# Patient Record
Sex: Female | Born: 1965 | Race: White | Hispanic: No | Marital: Single | Smoking: Former smoker
Health system: Southern US, Community
[De-identification: ages and names within clinical notes are randomized; demographics above are authoritative.]

## PROBLEM LIST (undated history)

## (undated) DIAGNOSIS — F32A Depression, unspecified: Secondary | ICD-10-CM

## (undated) DIAGNOSIS — F419 Anxiety disorder, unspecified: Secondary | ICD-10-CM

## (undated) DIAGNOSIS — E7800 Pure hypercholesterolemia, unspecified: Secondary | ICD-10-CM

## (undated) DIAGNOSIS — F329 Major depressive disorder, single episode, unspecified: Secondary | ICD-10-CM

## (undated) DIAGNOSIS — L409 Psoriasis, unspecified: Secondary | ICD-10-CM

## (undated) HISTORY — PX: ABDOMINAL HYSTERECTOMY: SHX81

## (undated) HISTORY — PX: FOOT SURGERY: SHX648

---

## 2013-07-23 ENCOUNTER — Encounter (HOSPITAL_COMMUNITY): Payer: Self-pay | Admitting: *Deleted

## 2013-07-23 ENCOUNTER — Ambulatory Visit (HOSPITAL_COMMUNITY)
Admission: RE | Admit: 2013-07-23 | Discharge: 2013-07-23 | Disposition: A | Discharge status: Home / Self Care | Payer: Self-pay | Attending: Psychiatry | Admitting: Psychiatry

## 2013-07-23 HISTORY — DX: Major depressive disorder, single episode, unspecified: F32.9

## 2013-07-23 HISTORY — DX: Depression, unspecified: F32.A

## 2013-07-23 HISTORY — DX: Anxiety disorder, unspecified: F41.9
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
## 2015-12-15 DIAGNOSIS — F331 Major depressive disorder, recurrent, moderate: Secondary | ICD-10-CM | POA: Diagnosis not present

## 2016-02-13 ENCOUNTER — Ambulatory Visit
Admission: RE | Admit: 2016-02-13 | Discharge: 2016-02-13 | Disposition: A | Discharge status: Home / Self Care | Payer: BLUE CROSS/BLUE SHIELD | Source: Ambulatory Visit | Attending: Internal Medicine | Admitting: Internal Medicine

## 2016-02-13 ENCOUNTER — Ambulatory Visit (INDEPENDENT_AMBULATORY_CARE_PROVIDER_SITE_OTHER): Payer: BLUE CROSS/BLUE SHIELD | Admitting: Internal Medicine

## 2016-02-13 ENCOUNTER — Encounter: Payer: Self-pay | Admitting: Internal Medicine

## 2016-02-13 VITALS — BP 118/72 | HR 77 | Temp 98.7°F | Ht 68.0 in | Wt 183.0 lb

## 2016-02-13 DIAGNOSIS — R202 Paresthesia of skin: Secondary | ICD-10-CM

## 2016-02-13 DIAGNOSIS — M542 Cervicalgia: Secondary | ICD-10-CM

## 2016-02-13 DIAGNOSIS — F32A Depression, unspecified: Secondary | ICD-10-CM

## 2016-02-13 DIAGNOSIS — F419 Anxiety disorder, unspecified: Secondary | ICD-10-CM

## 2016-02-13 DIAGNOSIS — F329 Major depressive disorder, single episode, unspecified: Secondary | ICD-10-CM

## 2016-02-13 DIAGNOSIS — F418 Other specified anxiety disorders: Secondary | ICD-10-CM | POA: Diagnosis not present

## 2016-02-13 LAB — CBC
HEMATOCRIT: 41.6 % (ref 35.0–45.0)
Hemoglobin: 14.1 g/dL (ref 11.7–15.5)
MCH: 31.3 pg (ref 27.0–33.0)
MCHC: 33.9 g/dL (ref 32.0–36.0)
MCV: 92.2 fL (ref 80.0–100.0)
MPV: 11.5 fL (ref 7.5–12.5)
PLATELETS: 255 10*3/uL (ref 140–400)
RBC: 4.51 MIL/uL (ref 3.80–5.10)
RDW: 13.2 % (ref 11.0–15.0)
WBC: 7.9 10*3/uL (ref 3.8–10.8)

## 2016-02-13 LAB — FOLATE: FOLATE: 8.9 ng/mL (ref >5.4)

## 2016-02-13 LAB — HEMOGLOBIN A1C
HEMOGLOBIN A1C: 5 % (ref <5.7)
Mean Plasma Glucose: 97 mg/dL

## 2016-02-13 LAB — VITAMIN B12: VITAMIN B 12: 301 pg/mL (ref 200–1100)

## 2016-02-13 NOTE — Progress Notes (Signed)
HPI  Pt presents to the clinic today to establish care and for management of the conditions listed below. She has not had a PCP in many years.  Anxiety and Depression: She takes Effexor daily and reports this does help. She follows with [facility].  She also reports numbness and tingling in her hands and feet. She first noticed this 3 months ago. It is intermittent. She does reports associated neck pain which started 3 months ago but denies low back pain. She does notice it in the middle of the night. She denies swelling in her hands or feet. She has not taken anything OTC for this.   Flu: never Tetanus: unsure Pap Smear: 2006 Mammogram: never Vision Screening: annually Dentist: as needed  Past Medical History:  Diagnosis Date  . Anxiety   . Depression     Current Outpatient Prescriptions  Medication Sig Dispense Refill  . venlafaxine (EFFEXOR) 75 MG tablet Take 75 mg by mouth every morning.     No current facility-administered medications for this visit.     No Known Allergies  Family History  Problem Relation Age of Onset  . Heart disease Maternal Grandmother   . Heart disease Maternal Grandfather   . Alzheimer's disease Paternal Grandmother     Social History   Social History  . Marital status: Single    Spouse name: N/A  . Number of children: N/A  . Years of education: N/A   Occupational History  . Not on file.   Social History Main Topics  . Smoking status: Former Games developermoker  . Smokeless tobacco: Never Used     Comment: recently quit 12/2015  . Alcohol use No  . Drug use: No  . Sexual activity: No   Other Topics Concern  . Not on file   Social History Narrative  . No narrative on file    ROS:  Constitutional: Denies fever, malaise, fatigue, headache or abrupt weight changes.  Respiratory: Denies difficulty breathing, shortness of breath, cough or sputum production.   Cardiovascular: Denies chest pain, chest tightness, palpitations or swelling in the  hands or feet.  Musculoskeletal: Pt reports neck pain. Denies decrease in range of motion, difficulty with gait, muscle pain or joint swelling.  Skin: Denies redness, rashes, lesions or ulcercations.  Neurological: Pt reports numbness and tingling in all extremities. Denies dizziness, difficulty with memory, difficulty with speech or problems with balance and coordination.  Psych: Pt reports history of anxiety and depression. Denies SI/HI.  No other specific complaints in a complete review of systems (except as listed in HPI above).  PE:  BP 118/72   Pulse 77   Temp 98.7 F (37.1 C) (Oral)   Ht 5\' 8"  (1.727 m)   Wt 183 lb (83 kg)   SpO2 97%   BMI 27.83 kg/m  Wt Readings from Last 3 Encounters:  02/13/16 183 lb (83 kg)    General: Appears her stated age, well developed, well nourished in NAD. Skin: Dry and intact. HEENT: PERRLA. Cardiovascular: Normal rate and rhythm. S1,S2 noted.  No murmur, rubs or gallops noted.  Pulmonary/Chest: Normal effort and positive vesicular breath sounds. No respiratory distress. No wheezes, rales or ronchi noted.  Musculoskeletal: Normal extension and rotation to the left of the cervical spine. Decreased flexion and extension to the right. No bony tenderness noted over the cervical or lumbar spine. Strength 5/5 BUE/BLE. Neurological: Alert and oriented. Sensation intact to upper and lower extremities. Negative Phalen's. Negative Tinel's. Psychiatric: She is mildly anxious appearing  today.    Assessment and Plan:  Paresthesia of bilateral upper and lower extremities:  Will check CBC, CMET, A1C, B12, Vit D and Folate Will follow up after results are back  Neck pain, greater than 3 month duration:  Xray cervical spine today  Make an appt for your annual exam Nicki Reaper, NP

## 2016-02-14 LAB — COMPREHENSIVE METABOLIC PANEL
ALT: 28 U/L (ref 6–29)
AST: 19 U/L (ref 10–35)
Albumin: 4.3 g/dL (ref 3.6–5.1)
Alkaline Phosphatase: 68 U/L (ref 33–115)
BUN: 12 mg/dL (ref 7–25)
CALCIUM: 9.6 mg/dL (ref 8.6–10.2)
CHLORIDE: 104 mmol/L (ref 98–110)
CO2: 25 mmol/L (ref 20–31)
Creat: 0.68 mg/dL (ref 0.50–1.10)
GLUCOSE: 67 mg/dL (ref 65–99)
POTASSIUM: 4.1 mmol/L (ref 3.5–5.3)
Sodium: 140 mmol/L (ref 135–146)
Total Bilirubin: 0.2 mg/dL (ref 0.2–1.2)
Total Protein: 6.8 g/dL (ref 6.1–8.1)

## 2016-02-14 LAB — VITAMIN D 25 HYDROXY (VIT D DEFICIENCY, FRACTURES): Vit D, 25-Hydroxy: 20 ng/mL — ABNORMAL LOW (ref 30–100)

## 2016-02-16 DIAGNOSIS — F329 Major depressive disorder, single episode, unspecified: Secondary | ICD-10-CM | POA: Insufficient documentation

## 2016-02-16 DIAGNOSIS — F32A Depression, unspecified: Secondary | ICD-10-CM | POA: Insufficient documentation

## 2016-02-16 DIAGNOSIS — F419 Anxiety disorder, unspecified: Secondary | ICD-10-CM

## 2016-02-16 NOTE — Assessment & Plan Note (Signed)
Controlled on Effexor She will [facility] to [facility]

## 2016-02-16 NOTE — Patient Instructions (Signed)
Paresthesia Paresthesia is an abnormal burning or prickling sensation. This sensation is generally felt in the hands, arms, legs, or feet. However, it may occur in any part of the body. Usually, it is not painful. The feeling may be described as:  Tingling or numbness.  Pins and needles.  Skin crawling.  Buzzing.  Limbs falling asleep.  Itching. Most people experience temporary (transient) paresthesia at some time in their lives. Paresthesia may occur when you breathe too quickly (hyperventilation). It can also occur without any apparent cause. Commonly, paresthesia occurs when pressure is placed on a nerve. The sensation quickly goes away after the pressure is removed. For some people, however, paresthesia is a long-lasting (chronic) condition that is caused by an underlying disorder. If you continue to have paresthesia, you may need further medical evaluation. HOME CARE INSTRUCTIONS Watch your condition for any changes. Taking the following actions may help to lessen any discomfort that you are feeling:  Avoid drinking alcohol.  Try acupuncture or massage to help relieve your symptoms.  Keep all follow-up visits as directed by your health care provider. This is important. SEEK MEDICAL CARE IF:  You continue to have episodes of paresthesia.  Your burning or prickling feeling gets worse when you walk.  You have pain, cramps, or dizziness.  You develop a rash. SEEK IMMEDIATE MEDICAL CARE IF:  You feel weak.  You have trouble walking or moving.  You have problems with speech, understanding, or vision.  You feel confused.  You cannot control your bladder or bowel movements.  You have numbness after an injury.  You faint.   This information is not intended to replace advice given to you by your health care provider. Make sure you discuss any questions you have with your health care provider.   Document Released: 03/26/2002 Document Revised: 08/20/2014 Document Reviewed:  04/01/2014 Elsevier Interactive Patient Education 2016 Elsevier Inc.  

## 2016-02-18 ENCOUNTER — Telehealth: Payer: Self-pay | Admitting: Internal Medicine

## 2016-02-18 ENCOUNTER — Other Ambulatory Visit: Payer: Self-pay | Admitting: Internal Medicine

## 2016-02-18 DIAGNOSIS — R202 Paresthesia of skin: Secondary | ICD-10-CM

## 2016-02-18 MED ORDER — VITAMIN D (ERGOCALCIFEROL) 1.25 MG (50000 UNIT) PO CAPS: 50000.0000 [IU] | ORAL_CAPSULE | ORAL | 0 refills | Status: DC

## 2016-02-18 NOTE — Telephone Encounter (Signed)
Patient returned Gina Montgomery's call.  Patient scheduled lab appointment on 05/18/16.  Patient's pharmacy is [facility]. Patient does want a neurology referral.  Patient can go anytime and said she can go to [place] or [place].

## 2016-02-18 NOTE — Addendum Note (Signed)
Addended by: Roena MaladyEVONTENNO, Pecola Haxton Y on: 02/18/2016 11:30 AM   Modules accepted: Orders

## 2016-03-16 DIAGNOSIS — F331 Major depressive disorder, recurrent, moderate: Secondary | ICD-10-CM | POA: Diagnosis not present

## 2016-03-25 ENCOUNTER — Encounter: Payer: Self-pay | Admitting: Neurology

## 2016-03-25 ENCOUNTER — Ambulatory Visit (INDEPENDENT_AMBULATORY_CARE_PROVIDER_SITE_OTHER): Payer: BLUE CROSS/BLUE SHIELD | Admitting: Neurology

## 2016-03-25 VITALS — BP 100/70 | HR 74 | Ht 68.0 in | Wt 187.8 lb

## 2016-03-25 DIAGNOSIS — R202 Paresthesia of skin: Secondary | ICD-10-CM | POA: Diagnosis not present

## 2016-03-25 DIAGNOSIS — E538 Deficiency of other specified B group vitamins: Secondary | ICD-10-CM | POA: Diagnosis not present

## 2016-03-25 NOTE — Progress Notes (Signed)
[facility]    Provider:  Dr Lucia GaskinsAhern Referring Provider: Lorre MunroeBaity, Regina W, NP Primary Care Physician:  Nicki ReaperBAITY, REGINA, NP  CC: Numbness and tingling in arms and legs  HPI:  Gina Montgomery is a 50 y.o. female here as a referral from Dr. Sampson SiBaity for paresthesias.PMHx depression and anxiety. She has had her hands falling asleep for 6 months. No inciting events. It is all the fingers and shoots up and down the arms. It occurs day and night. Unknown triggers. She has no feeling in the toes since having her feet reconstructed, this is not new. She says she starts feeling the tingling on the top of the head and it travels down her face to the whole body. She has this symptom every day. For example this morning it started in the top of her head and she felt like a quick flush all over her body tingling and right now it is below the neck. Happens every day, usually resolves by 3-4 pm. No weakness. No vision changes, no facial droop, Happens daily. Better if she is focused on something, worse when inactivity. No loss of consciousness. No inciting events, no head trauma, no new medications. No known triggers, always happens in her bed. She wakes up with tingling. No snoring. No significant fatigue during the day. No headaches, no vision changes. No other focal neurologic symptoms or modifiable factors. If she rocks back and forth she can make the symptoms go away. Patient wonders if it is anxiety. No other focal neurologic deficits, or modifiable factors.   Reviewed notes, labs and imaging from outside physicians, which showed:  BUN 12, creatinine 0.68 02/13/2016 B12 301  Review of Systems: Patient complains of symptoms per HPI as well as the following symptoms: Weight gain, fatigue, numbness, weakness, dizziness, depression, anxiety. Pertinent negatives per HPI. All others negative.   Social History   Social History  . Marital status: Single    Spouse name: N/A  . Number of children:  0  . Years of education: 12   Occupational History  . Self- employed     Social History Main Topics  . Smoking status: Former Smoker    Quit date: 01/18/2016  . Smokeless tobacco: Never Used     Comment: recently quit 12/2015  . Alcohol use No  . Drug use: No  . Sexual activity: No   Other Topics Concern  . Not on file   Social History Narrative   Lives  With mother   Caffeine use: 1 cup a day (coffee)   Drinks 1 soda per day    Family History  Problem Relation Age of Onset  . Heart disease Maternal Grandmother   . Heart disease Maternal Grandfather   . Alzheimer's disease Paternal Grandmother   . Cancer Neg Hx   . Diabetes Neg Hx   . Stroke Neg Hx     Past Medical History:  Diagnosis Date  . Anxiety   . Depression     Past Surgical History:  Procedure Laterality Date  . ABDOMINAL HYSTERECTOMY    . FOOT SURGERY Bilateral    75 bolts each foot    Current Outpatient Prescriptions  Medication Sig Dispense Refill  . venlafaxine (EFFEXOR) 75 MG tablet Take 75 mg by mouth every morning.    . Vitamin D, Ergocalciferol, (DRISDOL) 50000 units CAPS capsule Take 1 capsule (50,000 Units total) by mouth every 7 (seven) days. 12 capsule 0   No current facility-administered medications for this visit.  Allergies as of 03/25/2016  . (No Known Allergies)    Vitals: BP 100/70 (BP Location: Right Arm, Patient Position: Sitting, Cuff Size: Normal)   Pulse 74   Ht 5\' 8"  (1.727 m)   Wt 187 lb 12.8 oz (85.2 kg)   BMI 28.55 kg/m  Last Weight:  Wt Readings from Last 1 Encounters:  03/25/16 187 lb 12.8 oz (85.2 kg)   Last Height:   Ht Readings from Last 1 Encounters:  03/25/16 5\' 8"  (1.727 m)    Physical exam: Exam: Gen: NAD, tangential                 CV: RRR, no MRG. No Carotid Bruits. No peripheral edema, warm, nontender Eyes: Conjunctivae clear without exudates or hemorrhage  Neuro: Detailed Neurologic Exam  Speech:    Speech is normal; fluent and  spontaneous with normal comprehension.  Cognition:    The patient is oriented to person, place, and time;     recent and remote memory intact;     language fluent;     normal attention, concentration,     fund of knowledge Cranial Nerves:    The pupils are equal, round, and reactive to light. Attempted fundoscopic exam could not visualize. Visual fields are full to finger confrontation. Extraocular movements are intact. Trigeminal sensation is intact and the muscles of mastication are normal. The face is symmetric. The palate elevates in the midline. Hearing intact. Voice is normal. Shoulder shrug is normal. The tongue has normal motion without fasciculations.   Coordination:    Normal finger to nose and heel to shin.   Gait:  Normal native gait  Motor Observation:    No asymmetry, no atrophy, and no involuntary movements noted. Tone:    Normal muscle tone.    Posture:    Posture is normal. normal erect    Strength:    Strength is V/V in the upper and lower limbs.      Sensation: intact to LT     Reflex Exam:  DTR's:    Deep tendon reflexes in the upper and lower extremities are normal bilaterally.   Toes:    The toes are downgoing bilaterally.   Clonus:    Clonus is absent.      Assessment/Plan:  50 year old with depression and anxiety here for daily episodes of paresthesias starting at the top of her head and traveling throughout her body. She has tingling in the hands and numbness like they are falling asleep. She has chronic numbness in the feet since foot reconstruction.   B12 was 301, low normal, will check MMA  Emg/ncs of bilateral uppers to eval for CTS vs polyneuropathy or radiculopathy EEG to evaluate for sensory seizures/epileptiform activity If above negative consider MRI brain and/or cervical spine Episodes stop if she rocks back and forth, may be anxiety but this is a diagnosis of exclusion  CC: Dr. Daisy FloroBaity  Margit Batte, MD  [facility] [address] [place], [place] [id number]  Phone [phone] Fax [phone]

## 2016-03-25 NOTE — Patient Instructions (Signed)
Remember to drink plenty of fluid, eat healthy meals and do not skip any meals. Try to eat protein with a every meal and eat a healthy snack such as fruit or nuts in between meals. Try to keep a regular sleep-wake schedule and try to exercise daily, particularly in the form of walking, 20-30 minutes a day, if you can.   As far as diagnostic testing: Lab, emg/ncs, eeg  I would like to see you back for emg/ncs, sooner if we need to. Please call us with any interim questions, concerns, problems, updates or refill requests.   Our phone number is [phone]. We also have an after hours call service for urgent matters and there is a physician on-call for urgent questions. For any emergencies you know to call 911 or go to the nearest emergency room

## 2016-03-31 ENCOUNTER — Telehealth: Payer: Self-pay | Admitting: *Deleted

## 2016-03-31 LAB — METHYLMALONIC ACID, SERUM: METHYLMALONIC ACID: 262 nmol/L (ref 0–378)

## 2016-03-31 NOTE — Telephone Encounter (Signed)
-----   Message from Anson FretAntonia B Ahern, MD sent at 03/31/2016 10:20 AM EST ----- Labs normal thanks

## 2016-03-31 NOTE — Telephone Encounter (Signed)
Called and spoke with pt about normal labs per AA,MD note. Pt verbalized understanding.

## 2016-05-04 ENCOUNTER — Ambulatory Visit: Payer: BLUE CROSS/BLUE SHIELD

## 2016-05-06 ENCOUNTER — Encounter: Payer: BLUE CROSS/BLUE SHIELD | Admitting: Neurology

## 2016-05-11 ENCOUNTER — Other Ambulatory Visit: Payer: Self-pay | Admitting: Internal Medicine

## 2016-05-11 DIAGNOSIS — E559 Vitamin D deficiency, unspecified: Secondary | ICD-10-CM

## 2016-05-18 ENCOUNTER — Other Ambulatory Visit (INDEPENDENT_AMBULATORY_CARE_PROVIDER_SITE_OTHER): Payer: BLUE CROSS/BLUE SHIELD

## 2016-05-18 DIAGNOSIS — E559 Vitamin D deficiency, unspecified: Secondary | ICD-10-CM | POA: Diagnosis not present

## 2016-05-18 LAB — VITAMIN D 25 HYDROXY (VIT D DEFICIENCY, FRACTURES): VITD: 28.31 ng/mL — AB (ref 30.00–100.00)

## 2016-06-08 DIAGNOSIS — F331 Major depressive disorder, recurrent, moderate: Secondary | ICD-10-CM | POA: Diagnosis not present

## 2016-08-17 DIAGNOSIS — F331 Major depressive disorder, recurrent, moderate: Secondary | ICD-10-CM | POA: Diagnosis not present

## 2016-11-08 DIAGNOSIS — F331 Major depressive disorder, recurrent, moderate: Secondary | ICD-10-CM | POA: Diagnosis not present

## 2017-01-28 DIAGNOSIS — F331 Major depressive disorder, recurrent, moderate: Secondary | ICD-10-CM | POA: Diagnosis not present

## 2017-04-01 DIAGNOSIS — L253 Unspecified contact dermatitis due to other chemical products: Secondary | ICD-10-CM | POA: Diagnosis not present

## 2017-04-07 DIAGNOSIS — F331 Major depressive disorder, recurrent, moderate: Secondary | ICD-10-CM | POA: Diagnosis not present

## 2017-05-15 IMAGING — DX DG CERVICAL SPINE COMPLETE 4+V
5 series · 5 of 5 positions shown · non-contrast
Comparison: none

[c-spine lat]
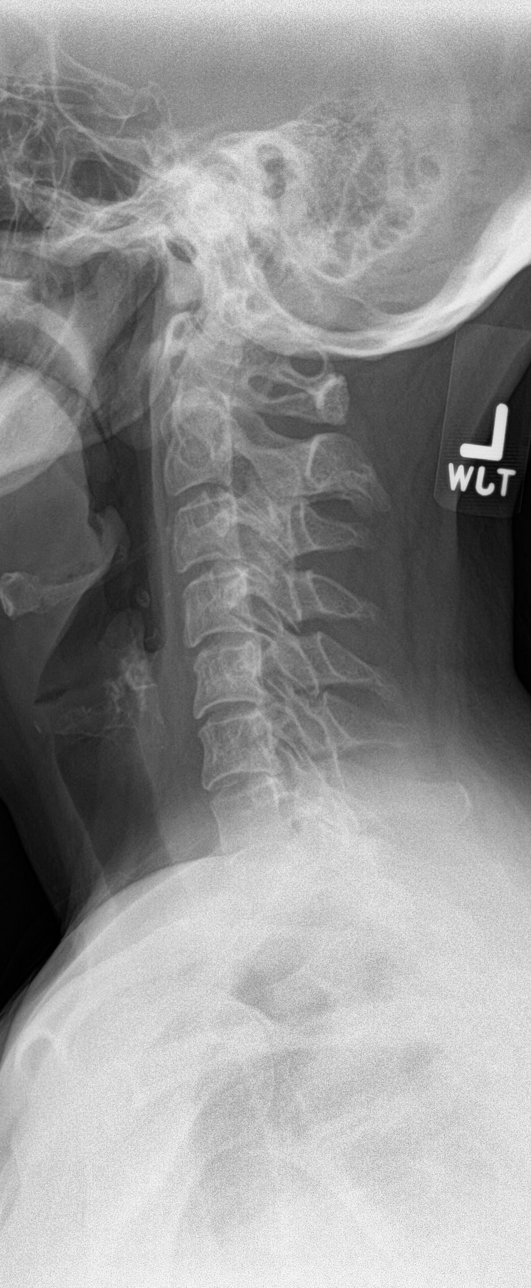

[c-spine obl (1 of 2)]
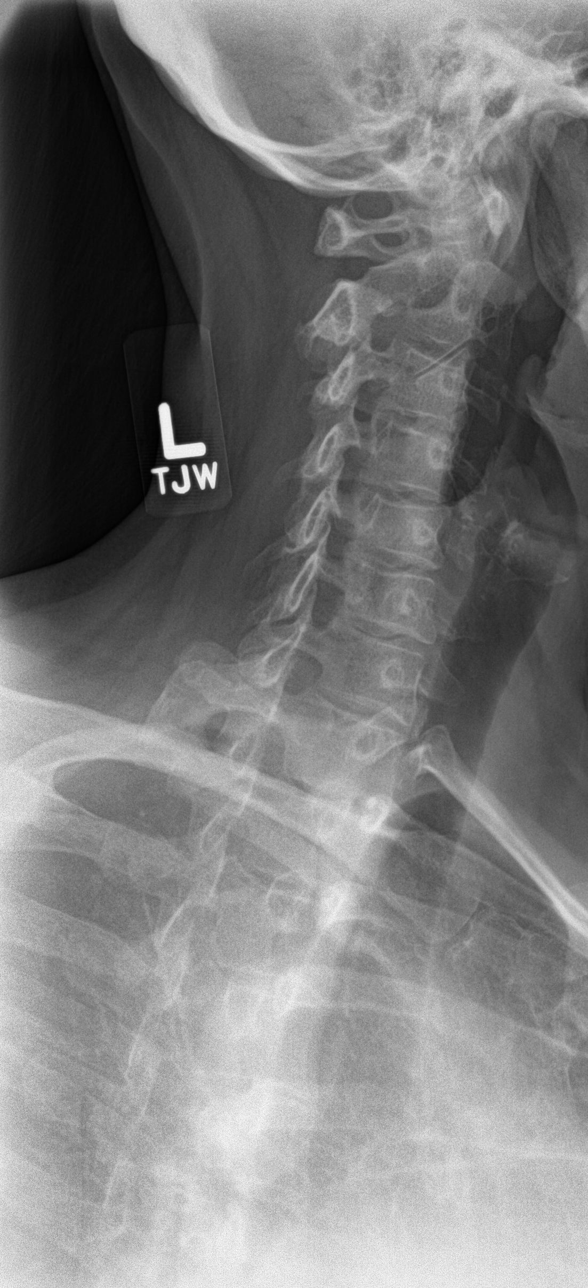

[c-spine obl (2 of 2)]
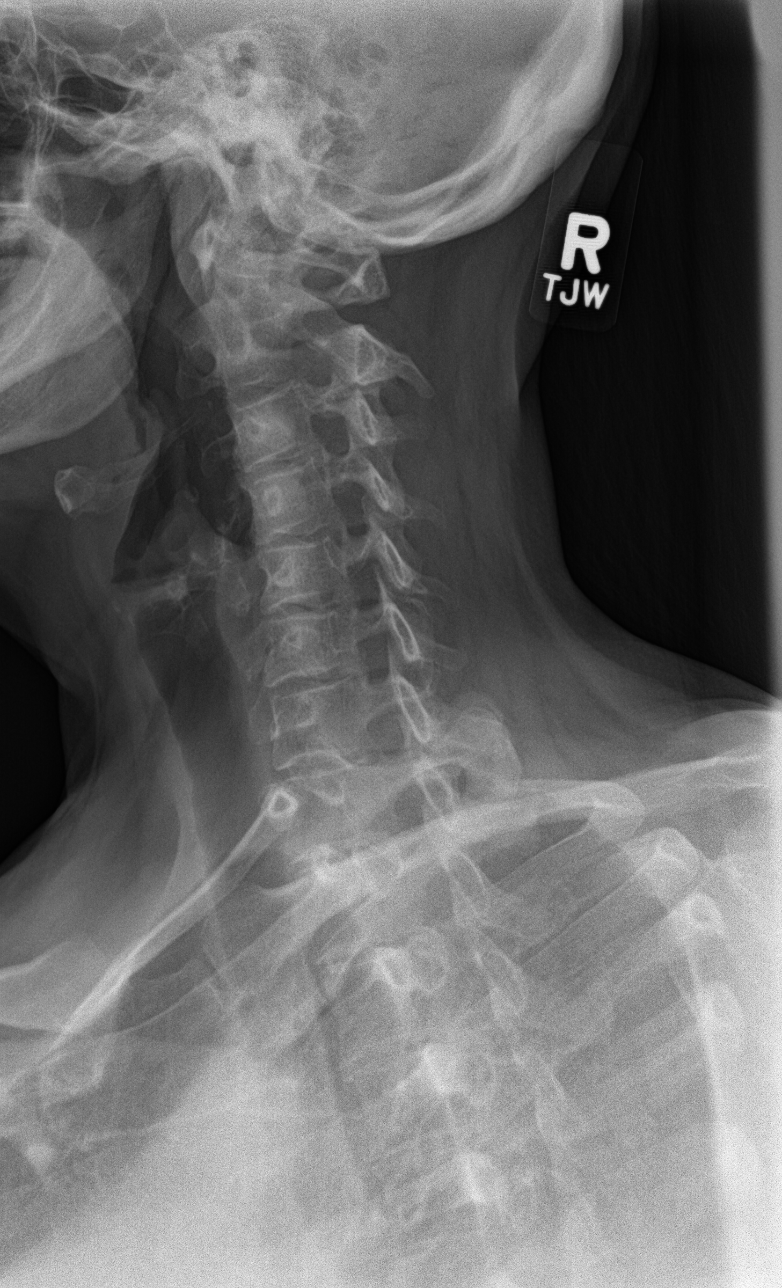

[c-spine ap]
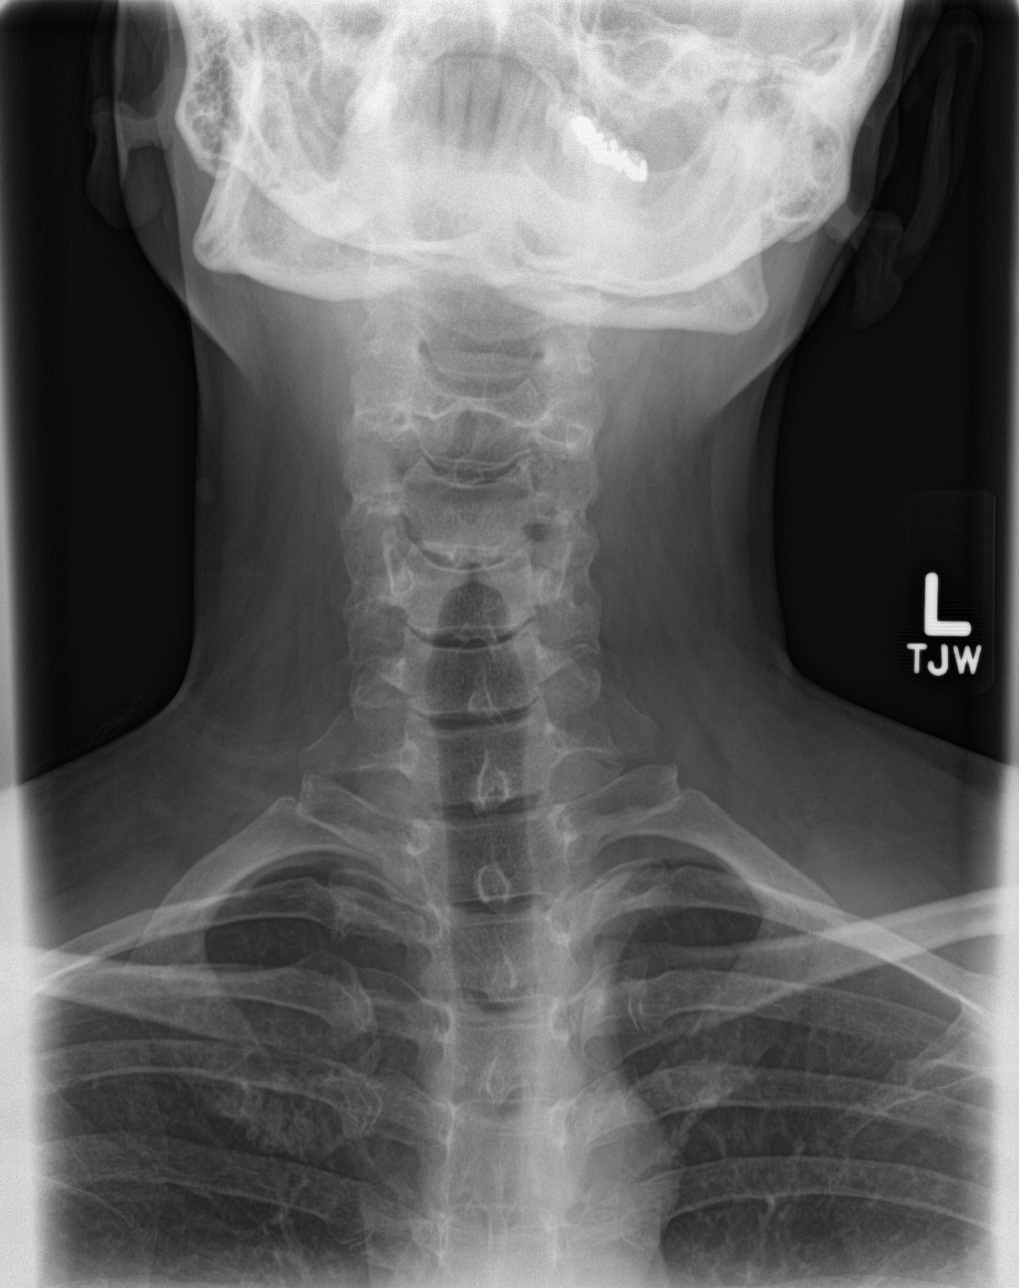

[c-spine open mouth]
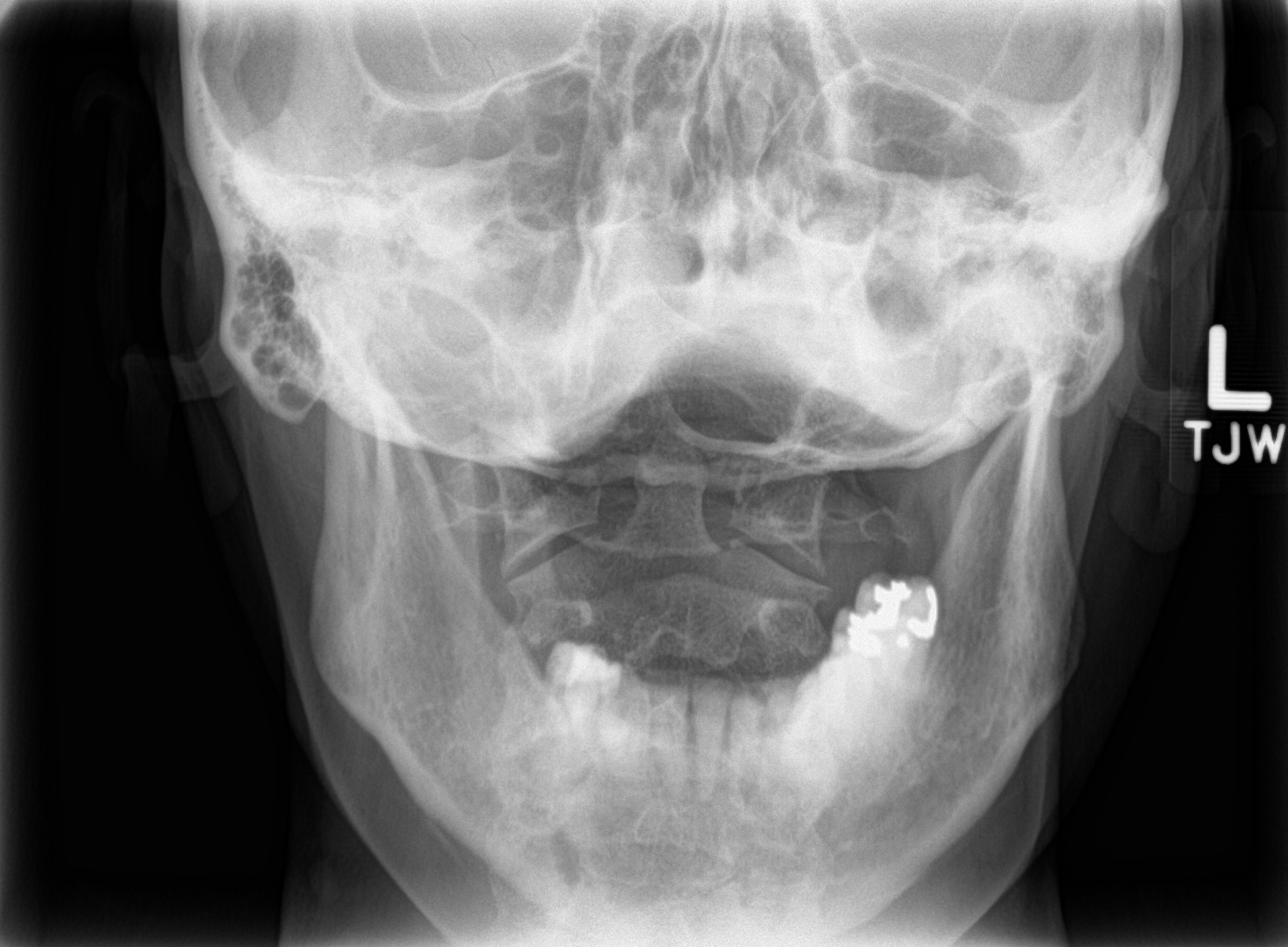

[5 of 5 positions shown; findings below may reference images not displayed]

Canned report from images found in remote index.

Refer to host system for actual result text.

## 2017-06-27 DIAGNOSIS — F331 Major depressive disorder, recurrent, moderate: Secondary | ICD-10-CM | POA: Diagnosis not present

## 2017-08-23 ENCOUNTER — Telehealth: Payer: Self-pay

## 2017-08-23 ENCOUNTER — Ambulatory Visit (INDEPENDENT_AMBULATORY_CARE_PROVIDER_SITE_OTHER): Payer: BLUE CROSS/BLUE SHIELD | Admitting: Internal Medicine

## 2017-08-23 ENCOUNTER — Encounter: Payer: Self-pay | Admitting: Internal Medicine

## 2017-08-23 VITALS — BP 108/76 | HR 62 | Temp 98.2°F | Wt 193.0 lb

## 2017-08-23 DIAGNOSIS — L409 Psoriasis, unspecified: Secondary | ICD-10-CM

## 2017-08-23 MED ORDER — CLOBETASOL PROPIONATE 0.05 % EX SHAM: 1.0000 "application " | MEDICATED_SHAMPOO | Freq: Two times a day (BID) | CUTANEOUS | 2 refills | Status: DC

## 2017-08-23 MED ORDER — CLOBETASOL PROPIONATE EMULSION 0.05 % EX FOAM
1.0000 | Freq: Two times a day (BID) | CUTANEOUS | 2 refills | Status: DC
Start: 2017-08-23 — End: 2017-08-24

## 2017-08-23 NOTE — Progress Notes (Signed)
Subjective:    Patient ID: Gina Montgomery, female    DOB: [birth year], 52 y.o.   MRN: [id number]  HPI  Pt presents to the clinic today with c/o a rash on the back of her neck. She noticed this 1 year ago. She reports the rash consist of small bumps. The bumps are painful and itchy. She reports the rash has spread to her ears and her palms. She denies any rashes on the torso, legs or feet. She reports colored discharge from the lesions. She has not changed any soaps, lotions or detergents. She denies changes in diet or medications. No one around her has a similar rash. She has tried Benadryl and Triamcinolone with minimal relief.  Review of Systems      Past Medical History:  Diagnosis Date  . Anxiety   . Depression     Current Outpatient Medications  Medication Sig Dispense Refill  . venlafaxine (EFFEXOR) 75 MG tablet Take 75 mg by mouth every morning.     No current facility-administered medications for this visit.     No Known Allergies  Family History  Problem Relation Age of Onset  . Heart disease Maternal Grandmother   . Heart disease Maternal Grandfather   . Alzheimer's disease Paternal Grandmother   . Cancer Neg Hx   . Diabetes Neg Hx   . Stroke Neg Hx     Social History   Socioeconomic History  . Marital status: Single    Spouse name: Not on file  . Number of children: 0  . Years of education: 4  . Highest education level: Not on file  Occupational History  . Occupation: Self- employed   Social Needs  . Financial resource strain: Not on file  . Food insecurity:    Worry: Not on file    Inability: Not on file  . Transportation needs:    Medical: Not on file    Non-medical: Not on file  Tobacco Use  . Smoking status: Former Smoker    Last attempt to quit: 01/18/2016    Years since quitting: 1.5  . Smokeless tobacco: Never Used  . Tobacco comment: recently quit 12/2015  Substance and Sexual Activity  . Alcohol use: No  . Drug use: No  . Sexual  activity: Never  Lifestyle  . Physical activity:    Days per week: Not on file    Minutes per session: Not on file  . Stress: Not on file  Relationships  . Social connections:    Talks on phone: Not on file    Gets together: Not on file    Attends religious service: Not on file    Active member of club or organization: Not on file    Attends meetings of clubs or organizations: Not on file    Relationship status: Not on file  . Intimate partner violence:    Fear of current or ex partner: Not on file    Emotionally abused: Not on file    Physically abused: Not on file    Forced sexual activity: Not on file  Other Topics Concern  . Not on file  Social History Narrative   Lives  With mother   Caffeine use: 1 cup a day (coffee)   Drinks 1 soda per day     Constitutional: Denies fever, malaise, fatigue, headache or abrupt weight changes.  Respiratory: Denies difficulty breathing, shortness of breath, cough or sputum production.   Cardiovascular: Denies chest pain, chest tightness, palpitations or swelling  in the hands or feet.  Skin: Pt reports rash. Denies ulcercations.    No other specific complaints in a complete review of systems (except as listed in HPI above).  Objective:   Physical Exam   BP 108/76   Pulse 62   Temp 98.2 F (36.8 C) (Oral)   Wt 193 lb (87.5 kg)   SpO2 98%   BMI 29.35 kg/m  Wt Readings from Last 3 Encounters:  08/23/17 193 lb (87.5 kg)  03/25/16 187 lb 12.8 oz (85.2 kg)  02/13/16 183 lb (83 kg)    General: Appears her stated age, well developed, well nourished in NAD. Skin: Linear, vesicular lesions with scaly plaque noted on bilateral palms. Posterior head with scaly, silvery plaques noted. Cardiovascular: Normal rate and rhythm. S1,S2 noted.  No murmur, rubs or gallops noted.  Pulmonary/Chest: Normal effort and positive vesicular breath sounds. No respiratory distress. No wheezes, rales or ronchi noted.  Neurological: Alert and oriented.    BMET    Component Value Date/Time   NA 140 02/13/2016 1546   K 4.1 02/13/2016 1546   CL 104 02/13/2016 1546   CO2 25 02/13/2016 1546   GLUCOSE 67 02/13/2016 1546   BUN 12 02/13/2016 1546   CREATININE 0.68 02/13/2016 1546   CALCIUM 9.6 02/13/2016 1546    Lipid Panel  No results found for: CHOL, TRIG, HDL, CHOLHDL, VLDL, LDLCALC  CBC    Component Value Date/Time   WBC 7.9 02/13/2016 1546   RBC 4.51 02/13/2016 1546   HGB 14.1 02/13/2016 1546   HCT 41.6 02/13/2016 1546   PLT 255 02/13/2016 1546   MCV 92.2 02/13/2016 1546   MCH 31.3 02/13/2016 1546   MCHC 33.9 02/13/2016 1546   RDW 13.2 02/13/2016 1546    Hgb A1C Lab Results  Component Value Date   HGBA1C 5.0 02/13/2016           Assessment & Plan:   Rash of Scalp/Palms, c/w Psoriasis:  eRx for Clobetasol Emollient- use 2 x day Referral to dermatology placed for further evaluation, possible skin biopsy  Return precautions discussed Nicki Reaper, NP

## 2017-08-23 NOTE — Patient Instructions (Signed)
Psoriasis  Psoriasis is a long-term (chronic) skin condition. It causes raised, red patches (plaques) on your skin that look silvery. The red patches may show up anywhere on your body. They can be any size or shape.  Psoriasis can come and go. It can range from mild to very bad. It cannot be passed from one person to another (not contagious). There is no cure for this condition, but it can be helped with treatment.  Follow these instructions at home:  Skin Care   Apply moisturizers to your skin as needed. Only use those that your doctor has said are okay.   Apply cool compresses to the affected areas.   Do not scratch your skin.  Lifestyle     Do not use tobacco products. This includes cigarettes, chewing tobacco, and e-cigarettes. If you need help quitting, ask your doctor.   Drink little or no alcohol.   Try to lower your stress. Meditation or yoga may help.   Get sun as told by your doctor. Do not get sunburned.   Think about joining a psoriasis support group.  Medicines   Take or use over-the-counter and prescription medicines only as told by your doctor.   If you were prescribed an antibiotic, take or use it as told by your doctor. Do not stop taking the antibiotic even if your condition starts to get better.  General instructions   Keep a journal. Use this to help track what triggers an outbreak. Try to avoid any triggers.   See a counselor or social worker if you feel very sad, upset, or hopeless about your condition and these feelings affect your work or relationships.   Keep all follow-up visits as told by your doctor. This is important.  Contact a doctor if:   Your pain gets worse.   You have more redness or warmth in the affected areas.   You have new pain or stiffness in your joints.   Your pain or stiffness in your joints gets worse.   Your nails start to break easily.   Your nails pull away from the nail bed easily.   You have a fever.   You feel very sad (depressed).  This  information is not intended to replace advice given to you by your health care provider. Make sure you discuss any questions you have with your health care provider.  Document Released: 05/13/2004 Document Revised: 09/11/2015 Document Reviewed: 08/21/2014  Elsevier Interactive Patient Education  2018 [employer]

## 2017-08-23 NOTE — Addendum Note (Signed)
Addended by: Lorre Munroe on: 08/23/2017 11:31 AM   Modules accepted: Orders

## 2017-08-23 NOTE — Telephone Encounter (Signed)
Patient came back by the office and states Clobetasol topical foam is not covered by her insurance and needs PA per [facility]. She is wanting to try a different topical cream/ointment. Patient would like to be notified when this is done please. CB is [phone] Ander Purpura, RMA

## 2017-08-23 NOTE — Telephone Encounter (Signed)
Will see if they cover the shampoo

## 2017-08-24 NOTE — Telephone Encounter (Signed)
Spoke to the pharmacist and he stated that the shampoo is covered

## 2017-08-24 NOTE — Addendum Note (Signed)
Addended by: Roena Malady on: 08/24/2017 10:24 AM   Modules accepted: Orders

## 2017-09-14 DIAGNOSIS — F331 Major depressive disorder, recurrent, moderate: Secondary | ICD-10-CM | POA: Diagnosis not present

## 2017-09-19 DIAGNOSIS — L918 Other hypertrophic disorders of the skin: Secondary | ICD-10-CM | POA: Diagnosis not present

## 2017-09-19 DIAGNOSIS — L400 Psoriasis vulgaris: Secondary | ICD-10-CM | POA: Diagnosis not present

## 2017-09-19 DIAGNOSIS — D225 Melanocytic nevi of trunk: Secondary | ICD-10-CM | POA: Diagnosis not present

## 2017-09-19 DIAGNOSIS — D485 Neoplasm of uncertain behavior of skin: Secondary | ICD-10-CM | POA: Diagnosis not present

## 2017-09-19 DIAGNOSIS — L820 Inflamed seborrheic keratosis: Secondary | ICD-10-CM | POA: Diagnosis not present

## 2017-09-19 DIAGNOSIS — D1724 Benign lipomatous neoplasm of skin and subcutaneous tissue of left leg: Secondary | ICD-10-CM | POA: Diagnosis not present

## 2017-10-14 DIAGNOSIS — L400 Psoriasis vulgaris: Secondary | ICD-10-CM | POA: Diagnosis not present

## 2017-10-17 DIAGNOSIS — L400 Psoriasis vulgaris: Secondary | ICD-10-CM | POA: Diagnosis not present

## 2017-10-19 DIAGNOSIS — L400 Psoriasis vulgaris: Secondary | ICD-10-CM | POA: Diagnosis not present

## 2017-10-21 DIAGNOSIS — L400 Psoriasis vulgaris: Secondary | ICD-10-CM | POA: Diagnosis not present

## 2017-10-24 DIAGNOSIS — L400 Psoriasis vulgaris: Secondary | ICD-10-CM | POA: Diagnosis not present

## 2017-10-26 DIAGNOSIS — L400 Psoriasis vulgaris: Secondary | ICD-10-CM | POA: Diagnosis not present

## 2017-10-28 DIAGNOSIS — L400 Psoriasis vulgaris: Secondary | ICD-10-CM | POA: Diagnosis not present

## 2017-11-07 DIAGNOSIS — L400 Psoriasis vulgaris: Secondary | ICD-10-CM | POA: Diagnosis not present

## 2017-11-09 DIAGNOSIS — L400 Psoriasis vulgaris: Secondary | ICD-10-CM | POA: Diagnosis not present

## 2017-11-11 DIAGNOSIS — L400 Psoriasis vulgaris: Secondary | ICD-10-CM | POA: Diagnosis not present

## 2017-11-22 DIAGNOSIS — L400 Psoriasis vulgaris: Secondary | ICD-10-CM | POA: Diagnosis not present

## 2017-11-24 DIAGNOSIS — L400 Psoriasis vulgaris: Secondary | ICD-10-CM | POA: Diagnosis not present

## 2017-12-07 DIAGNOSIS — F331 Major depressive disorder, recurrent, moderate: Secondary | ICD-10-CM | POA: Diagnosis not present

## 2018-01-17 DIAGNOSIS — L400 Psoriasis vulgaris: Secondary | ICD-10-CM | POA: Diagnosis not present

## 2018-02-21 DIAGNOSIS — F331 Major depressive disorder, recurrent, moderate: Secondary | ICD-10-CM | POA: Diagnosis not present

## 2018-05-09 DIAGNOSIS — F331 Major depressive disorder, recurrent, moderate: Secondary | ICD-10-CM | POA: Diagnosis not present

## 2018-08-03 DIAGNOSIS — F411 Generalized anxiety disorder: Secondary | ICD-10-CM | POA: Diagnosis not present

## 2018-08-03 DIAGNOSIS — F331 Major depressive disorder, recurrent, moderate: Secondary | ICD-10-CM | POA: Diagnosis not present

## 2018-10-26 DIAGNOSIS — F331 Major depressive disorder, recurrent, moderate: Secondary | ICD-10-CM | POA: Diagnosis not present

## 2018-10-26 DIAGNOSIS — F411 Generalized anxiety disorder: Secondary | ICD-10-CM | POA: Diagnosis not present

## 2019-01-25 DIAGNOSIS — F17200 Nicotine dependence, unspecified, uncomplicated: Secondary | ICD-10-CM | POA: Diagnosis not present

## 2019-01-25 DIAGNOSIS — F411 Generalized anxiety disorder: Secondary | ICD-10-CM | POA: Diagnosis not present

## 2019-01-25 DIAGNOSIS — F331 Major depressive disorder, recurrent, moderate: Secondary | ICD-10-CM | POA: Diagnosis not present

## 2019-01-31 ENCOUNTER — Ambulatory Visit: Payer: BLUE CROSS/BLUE SHIELD

## 2019-02-06 ENCOUNTER — Ambulatory Visit (INDEPENDENT_AMBULATORY_CARE_PROVIDER_SITE_OTHER): Payer: BC Managed Care – PPO

## 2019-02-06 DIAGNOSIS — Z111 Encounter for screening for respiratory tuberculosis: Secondary | ICD-10-CM

## 2019-02-06 NOTE — Progress Notes (Signed)
Patient received PPD placement today. Patient was instructed to come back on 02/08/2019 after 2:04 pm. PPD was placed on left lower arm.   Patient was also advised that she is due for CPE with [facility]. Patient verbalized understanding. If patient does not get this scheduled before Thursday need to schedule patient at that time.

## 2019-02-08 LAB — TB SKIN TEST
Induration: 0 mm
TB Skin Test: NEGATIVE

## 2020-02-17 ENCOUNTER — Ambulatory Visit (HOSPITAL_COMMUNITY)
Admission: EM | Admit: 2020-02-17 | Discharge: 2020-02-17 | Disposition: A | Discharge status: Home / Self Care | Payer: 59 | Attending: Emergency Medicine | Admitting: Emergency Medicine

## 2020-02-17 ENCOUNTER — Other Ambulatory Visit: Payer: Self-pay

## 2020-02-17 ENCOUNTER — Encounter (HOSPITAL_COMMUNITY): Payer: Self-pay

## 2020-02-17 DIAGNOSIS — R300 Dysuria: Secondary | ICD-10-CM

## 2020-02-17 DIAGNOSIS — R1030 Lower abdominal pain, unspecified: Secondary | ICD-10-CM | POA: Diagnosis present

## 2020-02-17 DIAGNOSIS — R350 Frequency of micturition: Secondary | ICD-10-CM

## 2020-02-17 DIAGNOSIS — R10A2 Flank pain, left side: Secondary | ICD-10-CM

## 2020-02-17 DIAGNOSIS — R109 Unspecified abdominal pain: Secondary | ICD-10-CM | POA: Insufficient documentation

## 2020-02-17 LAB — POCT URINALYSIS DIPSTICK, ED / UC
Bilirubin Urine: NEGATIVE
Glucose, UA: NEGATIVE mg/dL
Ketones, ur: NEGATIVE mg/dL
Nitrite: NEGATIVE
Protein, ur: NEGATIVE mg/dL
Specific Gravity, Urine: 1.01 (ref 1.005–1.030)
Urobilinogen, UA: 0.2 mg/dL (ref 0.0–1.0)
pH: 6 (ref 5.0–8.0)

## 2020-02-17 MED ORDER — CIPROFLOXACIN HCL 500 MG PO TABS: 500.0000 mg | ORAL_TABLET | Freq: Two times a day (BID) | ORAL | 0 refills | Status: DC

## 2020-02-17 NOTE — ED Triage Notes (Signed)
Pt present abdominal pain with urinary frequency. Pt states having diarrhea and abdominal cramping with a burning sensation while urinating.

## 2020-02-17 NOTE — Discharge Instructions (Signed)
You may have a urinary tract infection.   I have sent in Cipro for you to take twice a day for 7 days  We are going to culture your urine and will call you as soon as we have the results.   Drink plenty of water, 8-10 glasses per day.   You may take AZO over the counter for painful urination.  Follow up with your primary care provider as needed.   Go to the Emergency Department if you experience severe pain, shortness of breath, high fever, or other concerns.   

## 2020-02-17 NOTE — ED Provider Notes (Signed)
[facility]   CC: UTI  SUBJECTIVE:  Gina Montgomery is a 54 y.o. female who complains of urinary frequency, urgency and dysuria for the past 6 days. Also reports that she received her Covid booster vaccine x 6 days ago and is attributing her symptoms to this. Localizes the pain to the lower abdomen. Pain is intermittent and describes it as sharp. Has not attempted OTC treatment. Symptoms are made worse with urination. Admits to similar symptoms in the past. Denies fever, chills, nausea, vomiting, flank pain, abnormal vaginal discharge or bleeding, hematuria.    LMP: No LMP recorded. Patient has had a hysterectomy.  ROS: As in HPI.  All other pertinent ROS negative.     Past Medical History:  Diagnosis Date  . Anxiety   . Depression    Past Surgical History:  Procedure Laterality Date  . ABDOMINAL HYSTERECTOMY    . FOOT SURGERY Bilateral    75 bolts each foot   No Known Allergies No current facility-administered medications on file prior to encounter.   Current Outpatient Medications on File Prior to Encounter  Medication Sig Dispense Refill  . Clobetasol Propionate 0.05 % shampoo Apply 1 application topically 2 (two) times daily. 118 mL 2  . venlafaxine (EFFEXOR) 75 MG tablet Take 75 mg by mouth every morning.     Social History   Socioeconomic History  . Marital status: Single    Spouse name: Not on file  . Number of children: 0  . Years of education: 33  . Highest education level: Not on file  Occupational History  . Occupation: Self- employed   Tobacco Use  . Smoking status: Former Smoker    Quit date: 01/18/2016    Years since quitting: 4.0  . Smokeless tobacco: Never Used  . Tobacco comment: recently quit 12/2015  Substance and Sexual Activity  . Alcohol use: No  . Drug use: No  . Sexual activity: Never  Other Topics Concern  . Not on file  Social History Narrative   Lives  With mother   Caffeine use: 1 cup a day (coffee)   Drinks 1 soda per  day   Social Determinants of Health   Financial Resource Strain:   . Difficulty of Paying Living Expenses: Not on file  Food Insecurity:   . Worried About Programme researcher, broadcasting/film/video in the Last Year: Not on file  . Ran Out of Food in the Last Year: Not on file  Transportation Needs:   . Lack of Transportation (Medical): Not on file  . Lack of Transportation (Non-Medical): Not on file  Physical Activity:   . Days of Exercise per Week: Not on file  . Minutes of Exercise per Session: Not on file  Stress:   . Feeling of Stress : Not on file  Social Connections:   . Frequency of Communication with Friends and Family: Not on file  . Frequency of Social Gatherings with Friends and Family: Not on file  . Attends Religious Services: Not on file  . Active Member of Clubs or Organizations: Not on file  . Attends Banker Meetings: Not on file  . Marital Status: Not on file  Intimate Partner Violence:   . Fear of Current or Ex-Partner: Not on file  . Emotionally Abused: Not on file  . Physically Abused: Not on file  . Sexually Abused: Not on file   Family History  Problem Relation Age of Onset  . Heart disease Maternal Grandmother   .  Heart disease Maternal Grandfather   . Alzheimer's disease Paternal Grandmother   . Cancer Neg Hx   . Diabetes Neg Hx   . Stroke Neg Hx     OBJECTIVE:  Vitals:   02/17/20 1512  BP: 121/74  Pulse: (!) 57  Resp: 16  Temp: 98.2 F (36.8 C)  TempSrc: Oral  SpO2: 100%   General appearance: AOx3 in no acute distress HEENT: NCAT. Oropharynx clear.  Lungs: clear to auscultation bilaterally without adventitious breath sounds Heart: regular rate and rhythm. Radial pulses 2+ symmetrical bilaterally Abdomen: soft; non-distended; suprapubic tenderness; bowel sounds present; no guarding or rebound tenderness Back: no CVA tenderness Extremities: no edema; symmetrical with no gross deformities Skin: warm and dry Neurologic: Ambulates from chair to  exam table without difficulty Psychological: alert and cooperative; normal mood and affect  Labs Reviewed  URINE CULTURE - Abnormal; Notable for the following components:      Result Value   Culture   (*)    Value: <10,000 COLONIES/mL INSIGNIFICANT GROWTH Performed at [facility], [address], [place], [place] [address]    All other components within normal limits  POCT URINALYSIS DIPSTICK, ED / UC - Abnormal; Notable for the following components:   Hgb urine dipstick TRACE (*)    Leukocytes,Ua SMALL (*)    All other components within normal limits    ASSESSMENT & PLAN:  1. Dysuria   2. Lower abdominal pain   3. Left flank pain     Meds ordered this encounter  Medications  . ciprofloxacin (CIPRO) 500 MG tablet    Sig: Take 1 tablet (500 mg total) by mouth 2 (two) times daily.    Dispense:  14 tablet    Refill:  0    Order Specific Question:   Supervising Provider    Answer:   Merrilee Jansky [[id number]]   Prescribed Cipro Take imodium for diarrhea Urine culture sent  We will call you with abnormal results that need further treatment Push fluids and get plenty of rest Take antibiotic as directed and to completion Take AZO as needed for symptomatic relief Follow up with PCP if symptoms persists Return here or go to ER if you have any new or worsening symptoms such as fever, worsening abdominal pain, nausea/vomiting, flank pain  Outlined signs and symptoms indicating need for more acute intervention Patient verbalized understanding After Visit Summary given     Moshe Cipro, NP 02/19/20 1621

## 2020-02-18 LAB — URINE CULTURE: Culture: <10000 — AB

## 2020-04-22 ENCOUNTER — Telehealth: Payer: Self-pay | Admitting: Internal Medicine

## 2020-04-22 NOTE — Telephone Encounter (Signed)
She can make nurse visit to get this done, will need to be read in 48-72 hours. She will need to schedule an appt for her physical as well though since she has not been seen in a few years.

## 2020-04-22 NOTE — Telephone Encounter (Signed)
Patient is requesting a TB skin test for her new job. Patient is needing it by tomorrow. Can you please call patient and advise? Thank you EM

## 2020-04-23 NOTE — Telephone Encounter (Signed)
Patient got her TB test done at her new employer. She is starting a new job on Monday she will call back to schedule once she gets her job schedule. EM

## 2020-12-16 ENCOUNTER — Encounter: Payer: Self-pay | Admitting: Nurse Practitioner

## 2020-12-16 ENCOUNTER — Ambulatory Visit: Payer: 59

## 2020-12-16 ENCOUNTER — Ambulatory Visit (INDEPENDENT_AMBULATORY_CARE_PROVIDER_SITE_OTHER): Payer: 59 | Admitting: Nurse Practitioner

## 2020-12-16 ENCOUNTER — Other Ambulatory Visit: Payer: Self-pay

## 2020-12-16 VITALS — BP 122/62 | HR 72 | Temp 98.3°F | Resp 10 | Ht 68.0 in | Wt 186.2 lb

## 2020-12-16 DIAGNOSIS — M79671 Pain in right foot: Secondary | ICD-10-CM | POA: Diagnosis not present

## 2020-12-16 DIAGNOSIS — R221 Localized swelling, mass and lump, neck: Secondary | ICD-10-CM

## 2020-12-16 DIAGNOSIS — F419 Anxiety disorder, unspecified: Secondary | ICD-10-CM

## 2020-12-16 DIAGNOSIS — M79672 Pain in left foot: Secondary | ICD-10-CM

## 2020-12-16 DIAGNOSIS — Z111 Encounter for screening for respiratory tuberculosis: Secondary | ICD-10-CM | POA: Diagnosis not present

## 2020-12-16 DIAGNOSIS — Z7689 Persons encountering health services in other specified circumstances: Secondary | ICD-10-CM

## 2020-12-16 DIAGNOSIS — R252 Cramp and spasm: Secondary | ICD-10-CM | POA: Diagnosis not present

## 2020-12-16 DIAGNOSIS — F32A Depression, unspecified: Secondary | ICD-10-CM

## 2020-12-16 LAB — MAGNESIUM: Magnesium: 2 mg/dL (ref 1.5–2.5)

## 2020-12-16 LAB — CBC WITH DIFFERENTIAL/PLATELET
Basophils Absolute: 0 10*3/uL (ref 0.0–0.1)
Basophils Relative: 0.4 % (ref 0.0–3.0)
Eosinophils Absolute: 0.2 10*3/uL (ref 0.0–0.7)
Eosinophils Relative: 3 % (ref 0.0–5.0)
HCT: 40.5 % (ref 36.0–46.0)
Hemoglobin: 13.5 g/dL (ref 12.0–15.0)
Lymphocytes Relative: 26.8 % (ref 12.0–46.0)
Lymphs Abs: 1.9 10*3/uL (ref 0.7–4.0)
MCHC: 33.2 g/dL (ref 30.0–36.0)
MCV: 93.4 fl (ref 78.0–100.0)
Monocytes Absolute: 0.5 10*3/uL (ref 0.1–1.0)
Monocytes Relative: 6.7 % (ref 3.0–12.0)
Neutro Abs: 4.4 10*3/uL (ref 1.4–7.7)
Neutrophils Relative %: 63.1 % (ref 43.0–77.0)
Platelets: 237 10*3/uL (ref 150.0–400.0)
RBC: 4.34 Mil/uL (ref 3.87–5.11)
RDW: 12.8 % (ref 11.5–15.5)
WBC: 7.1 10*3/uL (ref 4.0–10.5)

## 2020-12-16 LAB — COMPREHENSIVE METABOLIC PANEL
ALT: 14 U/L (ref 0–35)
AST: 13 U/L (ref 0–37)
Albumin: 4 g/dL (ref 3.5–5.2)
Alkaline Phosphatase: 79 U/L (ref 39–117)
BUN: 8 mg/dL (ref 6–23)
CO2: 29 mEq/L (ref 19–32)
Calcium: 9.1 mg/dL (ref 8.4–10.5)
Chloride: 106 mEq/L (ref 96–112)
Creatinine, Ser: 0.71 mg/dL (ref 0.40–1.20)
GFR: 96.6 mL/min (ref >60.00)
Glucose, Bld: 77 mg/dL (ref 70–99)
Potassium: 3.9 mEq/L (ref 3.5–5.1)
Sodium: 141 mEq/L (ref 135–145)
Total Bilirubin: 0.4 mg/dL (ref 0.2–1.2)
Total Protein: 6.5 g/dL (ref 6.0–8.3)

## 2020-12-16 NOTE — Assessment & Plan Note (Signed)
States history of muscle cramps that she used to take tizanidine as needed.  Patient's not been seen in clinic in several years we will update blood work to make sure electrolytes are not cause.  Also consider gait abnormality is causing strain on muscles and causing cramping.  Pending lab results.

## 2020-12-16 NOTE — Assessment & Plan Note (Signed)
Unsure of etiology.  States her father had a cyst in his neck/behind the ear area that required removal.  She has seen dermatology in the past and states that the dermatologist is not comfortable with the area.  We will order ultrasound to determine lymph node versus cysts.  Also order labs in office CBC with differential.  Pending results

## 2020-12-16 NOTE — Assessment & Plan Note (Signed)
Extensive history in regards to patient's feet.  States she had trouble since she was a child.  Has had several surgeries.  Currently dealing with fourth toe bilateral feet curving medially underneath second and third digit.  Causing gait abnormalities and discomfort.  Patient has seen podiatry in the past she requested to not see podiatry she would rather see orthopedic surgery.  Referral placed.

## 2020-12-16 NOTE — Assessment & Plan Note (Signed)
Patient works for [employer].  States that she is in need of a PPD for her employer.  Will place PPD today.  No history of positive PPD/reactions.  No history of BCG vaccine.  Patient instructed to return in 48 hours for PPD reading.  She acknowledged.
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
## 2020-12-16 NOTE — Patient Instructions (Addendum)
Nice to see you today Will be in touch regarding labs Follow up with me in 1 month for a physical and fasting labs. Be seen in 48hrs for the TB skin test read

## 2020-12-16 NOTE — Assessment & Plan Note (Signed)
Reviewed personal history, surgical history, substance abuse history, and family history with patient in office.  Also reviewed medications verbally with patient.  EMR reviewed also

## 2020-12-16 NOTE — Assessment & Plan Note (Signed)
Did administer PHQ-9 and GAD-7 in office today.  Patient is followed by [facility].  States they were the ones who prescribed her venlafaxine and hydroxyzine.  Continue following up with them as recommended and continue taking medications of Effexor and hydroxyzine.

## 2020-12-18 ENCOUNTER — Encounter: Payer: Self-pay | Admitting: Nurse Practitioner

## 2020-12-18 LAB — TB SKIN TEST
Induration: 0 mm
TB Skin Test: NEGATIVE

## 2020-12-30 ENCOUNTER — Ambulatory Visit
Admission: RE | Admit: 2020-12-30 | Discharge: 2020-12-30 | Disposition: A | Discharge status: Home / Self Care | Payer: 59 | Source: Ambulatory Visit | Attending: Nurse Practitioner | Admitting: Nurse Practitioner

## 2020-12-30 ENCOUNTER — Other Ambulatory Visit: Payer: Self-pay

## 2020-12-30 DIAGNOSIS — R221 Localized swelling, mass and lump, neck: Secondary | ICD-10-CM | POA: Diagnosis present

## 2021-01-01 ENCOUNTER — Other Ambulatory Visit: Payer: Self-pay | Admitting: Nurse Practitioner

## 2021-01-01 DIAGNOSIS — R221 Localized swelling, mass and lump, neck: Secondary | ICD-10-CM

## 2021-01-14 ENCOUNTER — Other Ambulatory Visit (HOSPITAL_COMMUNITY): Payer: Self-pay | Admitting: Orthopedic Surgery

## 2021-01-19 ENCOUNTER — Encounter: Payer: 59 | Admitting: Nurse Practitioner

## 2021-01-22 ENCOUNTER — Encounter: Payer: Self-pay | Admitting: Nurse Practitioner

## 2021-01-22 ENCOUNTER — Ambulatory Visit (INDEPENDENT_AMBULATORY_CARE_PROVIDER_SITE_OTHER): Payer: 59 | Admitting: Nurse Practitioner

## 2021-01-22 ENCOUNTER — Other Ambulatory Visit: Payer: Self-pay

## 2021-01-22 VITALS — BP 96/62 | HR 66 | Temp 97.9°F | Resp 12 | Ht 68.0 in | Wt 187.4 lb

## 2021-01-22 DIAGNOSIS — M79672 Pain in left foot: Secondary | ICD-10-CM

## 2021-01-22 DIAGNOSIS — Z0000 Encounter for general adult medical examination without abnormal findings: Secondary | ICD-10-CM

## 2021-01-22 DIAGNOSIS — Z1211 Encounter for screening for malignant neoplasm of colon: Secondary | ICD-10-CM | POA: Diagnosis not present

## 2021-01-22 DIAGNOSIS — Z1321 Encounter for screening for nutritional disorder: Secondary | ICD-10-CM

## 2021-01-22 DIAGNOSIS — Z780 Asymptomatic menopausal state: Secondary | ICD-10-CM

## 2021-01-22 DIAGNOSIS — R5383 Other fatigue: Secondary | ICD-10-CM | POA: Insufficient documentation

## 2021-01-22 DIAGNOSIS — Z1231 Encounter for screening mammogram for malignant neoplasm of breast: Secondary | ICD-10-CM

## 2021-01-22 DIAGNOSIS — R221 Localized swelling, mass and lump, neck: Secondary | ICD-10-CM

## 2021-01-22 DIAGNOSIS — F419 Anxiety disorder, unspecified: Secondary | ICD-10-CM

## 2021-01-22 DIAGNOSIS — M79671 Pain in right foot: Secondary | ICD-10-CM

## 2021-01-22 DIAGNOSIS — F32A Depression, unspecified: Secondary | ICD-10-CM

## 2021-01-22 NOTE — Assessment & Plan Note (Signed)
Patient underwent a total hysterectomy.  States that she was on Premarin till 2007 and took a break until 2011 and has not been on Premarin since.  She was curious about that medication.  She was told that she would need to be on it for the rest of her life.  Discussed with her that traditionally were not on hormone replacement therapy for the rest of her life.  Also explained the increased risks especially with smoking of cardiovascular event, DVT/PE, increased risk of cancers.  Patient acknowledged deferring Premarin for now

## 2021-01-22 NOTE — Assessment & Plan Note (Signed)
Followed by [facility]er their illicit manager venlafaxine and hydroxyzine.  She states she also talks to a therapist.  Continue following up with his folks

## 2021-01-22 NOTE — Assessment & Plan Note (Signed)
She got in with orthopedist and they are currently doing corrective surgery February 05, 2021.

## 2021-01-22 NOTE — Progress Notes (Signed)
Established Patient Office Visit  Subjective:  Patient ID: Gina Montgomery, female    DOB: [birth year]  Age: 55 y.o. MRN: [id number]  CC:  Chief Complaint  Patient presents with   Annual Exam    HPI Gina Montgomery presents for complete physical and follow up of chronic conditions.  Immunizations: -Tetanus: Unsure  -Influenza: Refused -Covid-19: Moderna x2 and x2 booster -Shingles: Refused -Pneumonia: NA  -HPV: NA  Diet: Fair diet. Once a day meal with little snacks in between Exercise: No regular exercise. Pending foot surgery  Eye exam: Completes 2021 need this year Dental exam: Completes every 4 months. Upper dentures partial bottom  Pap Smear: Completed in NA. Hysterectomy Mammogram: Completed in Never. Order today Dexa: Completed in never. Order today Colonoscopy:  never order today   Lung Cancer Screening: NA Currently vapes but having to stop for surgery  PHQ9 SCORE ONLY 12/16/2020 02/13/2016  PHQ-9 Total Score 7 0    Surgery 02/05/2021 Past Medical History:  Diagnosis Date   Anxiety    Depression     Past Surgical History:  Procedure Laterality Date   ABDOMINAL HYSTERECTOMY     FOOT SURGERY Bilateral    75 bolts each foot    Family History  Problem Relation Age of Onset   Cancer Mother        leukemia   Cancer Maternal Aunt        breast cancer   Cancer Paternal Aunt        breast cancer   Heart disease Maternal Grandmother    Heart disease Maternal Grandfather    Alzheimer's disease Paternal Grandmother    Diabetes Neg Hx    Stroke Neg Hx     Social History   Socioeconomic History   Marital status: Single    Spouse name: Not on file   Number of children: 0   Years of education: 12   Highest education level: Not on file  Occupational History   Occupation: Self- employed   Tobacco Use   Smoking status: Former    Types: Cigarettes    Quit date: 01/18/2016    Years since quitting: 5.0   Smokeless tobacco: Never   Tobacco  comments:    recently quit 12/2015  Vaping Use   Vaping Use: Every day   Substances: Nicotine  Substance and Sexual Activity   Alcohol use: No   Drug use: No   Sexual activity: Never  Other Topics Concern   Not on file  Social History Narrative   Lives  With mother   Caffeine use: 1 cup a day (coffee)   Drinks 1 soda per day   Social Determinants of Health   Financial Resource Strain: Not on file  Food Insecurity: Not on file  Transportation Needs: Not on file  Physical Activity: Not on file  Stress: Not on file  Social Connections: Not on file  Intimate Partner Violence: Not on file    Outpatient Medications Prior to Visit  Medication Sig Dispense Refill   hydrOXYzine (ATARAX/VISTARIL) 25 MG tablet Take 25 mg by mouth 3 (three) times daily.     tiZANidine (ZANAFLEX) 4 MG tablet Take 4 mg by mouth every 6 (six) hours as needed for muscle spasms.     venlafaxine (EFFEXOR) 75 MG tablet Take 75 mg by mouth every morning.     No facility-administered medications prior to visit.    No Known Allergies  ROS Review of Systems  Constitutional:  Positive for fatigue. Negative for  chills and fever.  Respiratory:  Negative for cough and shortness of breath.   Cardiovascular:  Negative for chest pain.  Gastrointestinal:  Positive for constipation (takes pysillium). Negative for blood in stool, nausea and vomiting.  Musculoskeletal:  Positive for arthralgias.  Skin:  Negative for color change.  Neurological:  Positive for numbness. Negative for headaches.  Psychiatric/Behavioral:  Negative for hallucinations and suicidal ideas.      Objective:    Physical Exam Vitals and nursing note reviewed.  Constitutional:      Appearance: Normal appearance.  HENT:     Right Ear: Tympanic membrane, ear canal and external ear normal. There is no impacted cerumen.     Left Ear: Tympanic membrane, ear canal and external ear normal. There is no impacted cerumen.     Mouth/Throat:      Mouth: Mucous membranes are moist.     Pharynx: Oropharynx is clear.  Eyes:     Extraocular Movements: Extraocular movements intact.     Pupils: Pupils are equal, round, and reactive to light.  Cardiovascular:     Rate and Rhythm: Normal rate and regular rhythm.  Pulmonary:     Effort: Pulmonary effort is normal.     Breath sounds: Normal breath sounds.  Abdominal:     General: Bowel sounds are normal. There is no distension.     Tenderness: There is no abdominal tenderness.  Musculoskeletal:     Right lower leg: No edema.     Left lower leg: No edema.  Skin:    General: Skin is warm.  Neurological:     Mental Status: She is alert.     Motor: No weakness.     Coordination: Coordination normal.     Deep Tendon Reflexes: Reflexes normal.  Psychiatric:        Mood and Affect: Mood normal.        Behavior: Behavior normal.        Thought Content: Thought content normal.        Judgment: Judgment normal.    BP 96/62   Pulse 66   Temp 97.9 F (36.6 C)   Resp 12   Ht 5\' 8"  (1.727 m)   Wt 187 lb 6 oz (85 kg)   SpO2 99%   BMI 28.49 kg/m  Wt Readings from Last 3 Encounters:  01/22/21 187 lb 6 oz (85 kg)  12/16/20 186 lb 4 oz (84.5 kg)  08/23/17 193 lb (87.5 kg)     Health Maintenance Due  Topic Date Due   HIV Screening  Never done   Hepatitis C Screening  Never done   TETANUS/TDAP  Never done   PAP SMEAR-Modifier  Never done   COLONOSCOPY (Pts 45-33yrs Insurance coverage will need to be confirmed)  Never done   MAMMOGRAM  Never done   Zoster Vaccines- Shingrix (1 of 2) Never done    There are no preventive care reminders to display for this patient.  No results found for: TSH Lab Results  Component Value Date   WBC 7.1 12/16/2020   HGB 13.5 12/16/2020   HCT 40.5 12/16/2020   MCV 93.4 12/16/2020   PLT 237.0 12/16/2020   Lab Results  Component Value Date   NA 141 12/16/2020   K 3.9 12/16/2020   CO2 29 12/16/2020   GLUCOSE 77 12/16/2020   BUN 8 12/16/2020    CREATININE 0.71 12/16/2020   BILITOT 0.4 12/16/2020   ALKPHOS 79 12/16/2020   AST 13 12/16/2020  ALT 14 12/16/2020   PROT 6.5 12/16/2020   ALBUMIN 4.0 12/16/2020   CALCIUM 9.1 12/16/2020   GFR 96.60 12/16/2020   No results found for: CHOL No results found for: HDL No results found for: LDLCALC No results found for: TRIG No results found for: CHOLHDL Lab Results  Component Value Date   HGBA1C 5.0 02/13/2016      Assessment & Plan:   Problem List Items Addressed This Visit       Other   Anxiety and depression    Followed by [facility] their illicit manager venlafaxine and hydroxyzine.  She states she also talks to a therapist.  Continue following up with his folks      Lump in neck    Reviewed ultrasound with patient at a different time.  Sent her to ENT patient sees ENT tomorrow 01/23/2021.  States they are going to do biopsy.  [facility]      Bilateral foot pain    She got in with orthopedist and they are currently doing corrective surgery February 05, 2021.      Preventative health care - Primary    Discussed preventative health with patient states she has not had any screening exams since she is turned 51.  We will place orders for all necessary screening and preventative health measures.  Discussed this with patient.      Relevant Orders   CBC   Comprehensive metabolic panel   Lipid panel   Other fatigue   Relevant Orders   TSH   Post-menopausal    Patient underwent a total hysterectomy.  States that she was on Premarin till 2007 and took a break until 2011 and has not been on Premarin since.  She was curious about that medication.  She was told that she would need to be on it for the rest of her life.  Discussed with her that traditionally were not on hormone replacement therapy for the rest of her life.  Also explained the increased risks especially with smoking of cardiovascular event, DVT/PE, increased risk of cancers.  Patient acknowledged deferring  Premarin for now      Relevant Orders   DG Bone Density   Other Visit Diagnoses     Encounter for vitamin deficiency screening       Relevant Orders   VITAMIN D 25 Hydroxy (Vit-D Deficiency, Fractures)   Screening for colon cancer       Relevant Orders   Ambulatory referral to Gastroenterology   Encounter for screening mammogram for malignant neoplasm of breast       Relevant Orders   MM Digital Screening       No orders of the defined types were placed in this encounter.   Follow-up: Return in about 6 months (around 07/23/2021) for Recheck.   This visit occurred during the SARS-CoV-2 public health emergency.  Safety protocols were in place, including screening questions prior to the visit, additional usage of staff PPE, and extensive cleaning of exam room while observing appropriate contact time as indicated for disinfecting solutions.   Audria Nine, NP

## 2021-01-22 NOTE — Patient Instructions (Signed)
Nice to see you  Will see you in 6 months to see how you are doing

## 2021-01-22 NOTE — Assessment & Plan Note (Signed)
Reviewed ultrasound with patient at a different time.  Sent her to ENT patient sees ENT tomorrow 01/23/2021.  States they are going to do biopsy.  [facility]

## 2021-01-22 NOTE — Assessment & Plan Note (Signed)
Discussed preventative health with patient states she has not had any screening exams since she is turned 51.  We will place orders for all necessary screening and preventative health measures.  Discussed this with patient.

## 2021-01-23 ENCOUNTER — Other Ambulatory Visit: Payer: Self-pay | Admitting: Otolaryngology

## 2021-01-23 ENCOUNTER — Other Ambulatory Visit (HOSPITAL_COMMUNITY): Payer: Self-pay | Admitting: Otolaryngology

## 2021-01-23 DIAGNOSIS — R221 Localized swelling, mass and lump, neck: Secondary | ICD-10-CM

## 2021-01-23 LAB — CBC
HCT: 41.6 % (ref 36.0–46.0)
Hemoglobin: 13.9 g/dL (ref 12.0–15.0)
MCHC: 33.3 g/dL (ref 30.0–36.0)
MCV: 93 fl (ref 78.0–100.0)
Platelets: 245 10*3/uL (ref 150.0–400.0)
RBC: 4.48 Mil/uL (ref 3.87–5.11)
RDW: 12.7 % (ref 11.5–15.5)
WBC: 6.2 10*3/uL (ref 4.0–10.5)

## 2021-01-23 LAB — COMPREHENSIVE METABOLIC PANEL
ALT: 17 U/L (ref 0–35)
AST: 13 U/L (ref 0–37)
Albumin: 4.2 g/dL (ref 3.5–5.2)
Alkaline Phosphatase: 86 U/L (ref 39–117)
BUN: 11 mg/dL (ref 6–23)
CO2: 31 mEq/L (ref 19–32)
Calcium: 9.3 mg/dL (ref 8.4–10.5)
Chloride: 105 mEq/L (ref 96–112)
Creatinine, Ser: 0.77 mg/dL (ref 0.40–1.20)
GFR: 87.58 mL/min (ref >60.00)
Glucose, Bld: 71 mg/dL (ref 70–99)
Potassium: 3.9 mEq/L (ref 3.5–5.1)
Sodium: 143 mEq/L (ref 135–145)
Total Bilirubin: 0.2 mg/dL (ref 0.2–1.2)
Total Protein: 6.6 g/dL (ref 6.0–8.3)

## 2021-01-23 LAB — VITAMIN D 25 HYDROXY (VIT D DEFICIENCY, FRACTURES): VITD: 20.15 ng/mL — ABNORMAL LOW (ref 30.00–100.00)

## 2021-01-23 LAB — LIPID PANEL
Cholesterol: 180 mg/dL (ref 0–200)
HDL: 32.5 mg/dL — ABNORMAL LOW (ref >39.00)
LDL Cholesterol: 116 mg/dL — ABNORMAL HIGH (ref 0–99)
NonHDL: 147.36
Total CHOL/HDL Ratio: 6
Triglycerides: 155 mg/dL — ABNORMAL HIGH (ref 0.0–149.0)
VLDL: 31 mg/dL (ref 0.0–40.0)

## 2021-01-23 LAB — TSH: TSH: 1.63 u[IU]/mL (ref 0.35–5.50)

## 2021-01-28 ENCOUNTER — Encounter (HOSPITAL_BASED_OUTPATIENT_CLINIC_OR_DEPARTMENT_OTHER): Payer: Self-pay | Admitting: Orthopedic Surgery

## 2021-02-04 NOTE — Progress Notes (Signed)

## 2021-02-05 ENCOUNTER — Encounter (HOSPITAL_BASED_OUTPATIENT_CLINIC_OR_DEPARTMENT_OTHER)
Admission: RE | Disposition: A | Discharge status: Home / Self Care | Payer: Self-pay | Source: Home / Self Care | Attending: Orthopedic Surgery

## 2021-02-05 ENCOUNTER — Ambulatory Visit (HOSPITAL_BASED_OUTPATIENT_CLINIC_OR_DEPARTMENT_OTHER)
Admission: RE | Admit: 2021-02-05 | Discharge: 2021-02-05 | Disposition: A | Discharge status: Home / Self Care | Payer: 59 | Attending: Orthopedic Surgery | Admitting: Orthopedic Surgery

## 2021-02-05 ENCOUNTER — Ambulatory Visit (HOSPITAL_BASED_OUTPATIENT_CLINIC_OR_DEPARTMENT_OTHER): Payer: 59 | Admitting: Certified Registered"

## 2021-02-05 ENCOUNTER — Other Ambulatory Visit: Payer: Self-pay

## 2021-02-05 ENCOUNTER — Encounter (HOSPITAL_BASED_OUTPATIENT_CLINIC_OR_DEPARTMENT_OTHER): Payer: Self-pay | Admitting: Orthopedic Surgery

## 2021-02-05 ENCOUNTER — Ambulatory Visit (HOSPITAL_BASED_OUTPATIENT_CLINIC_OR_DEPARTMENT_OTHER): Payer: 59

## 2021-02-05 DIAGNOSIS — M2041 Other hammer toe(s) (acquired), right foot: Secondary | ICD-10-CM | POA: Diagnosis not present

## 2021-02-05 DIAGNOSIS — M21621 Bunionette of right foot: Secondary | ICD-10-CM | POA: Insufficient documentation

## 2021-02-05 DIAGNOSIS — T8484XA Pain due to internal orthopedic prosthetic devices, implants and grafts, initial encounter: Secondary | ICD-10-CM | POA: Insufficient documentation

## 2021-02-05 DIAGNOSIS — E7800 Pure hypercholesterolemia, unspecified: Secondary | ICD-10-CM | POA: Insufficient documentation

## 2021-02-05 DIAGNOSIS — M7741 Metatarsalgia, right foot: Secondary | ICD-10-CM

## 2021-02-05 DIAGNOSIS — X58XXXA Exposure to other specified factors, initial encounter: Secondary | ICD-10-CM | POA: Diagnosis not present

## 2021-02-05 DIAGNOSIS — Z87891 Personal history of nicotine dependence: Secondary | ICD-10-CM | POA: Insufficient documentation

## 2021-02-05 DIAGNOSIS — Y831 Surgical operation with implant of artificial internal device as the cause of abnormal reaction of the patient, or of later complication, without mention of misadventure at the time of the procedure: Secondary | ICD-10-CM | POA: Diagnosis not present

## 2021-02-05 HISTORY — DX: Psoriasis, unspecified: L40.9

## 2021-02-05 HISTORY — PX: HAMMERTOE RECONSTRUCTION WITH WEIL OSTEOTOMY: SHX5631

## 2021-02-05 HISTORY — DX: Pure hypercholesterolemia, unspecified: E78.00

## 2021-02-05 HISTORY — PX: HARDWARE REMOVAL: SHX979

## 2021-02-05 SURGERY — REMOVAL, HARDWARE
Anesthesia: Regional | Site: Foot | Laterality: Right

## 2021-02-05 MED ORDER — 0.9 % SODIUM CHLORIDE (POUR BTL) OPTIME
TOPICAL | Status: DC | PRN
  Administered 2021-02-05: 200 mL

## 2021-02-05 MED ORDER — FENTANYL CITRATE (PF) 100 MCG/2ML IJ SOLN
INTRAMUSCULAR | Status: AC
  Filled 2021-02-05: qty 2

## 2021-02-05 MED ORDER — ROPIVACAINE HCL 5 MG/ML IJ SOLN
INTRAMUSCULAR | Status: DC | PRN
  Administered 2021-02-05: 30 mL via PERINEURAL

## 2021-02-05 MED ORDER — DOCUSATE SODIUM 100 MG PO CAPS: 100.0000 mg | ORAL_CAPSULE | Freq: Two times a day (BID) | ORAL | 0 refills | Status: AC

## 2021-02-05 MED ORDER — OXYCODONE HCL 5 MG PO TABS: 5.0000 mg | ORAL_TABLET | ORAL | 0 refills | Status: AC | PRN

## 2021-02-05 MED ORDER — VANCOMYCIN HCL 500 MG IV SOLR
INTRAVENOUS | Status: AC
  Filled 2021-02-05: qty 10

## 2021-02-05 MED ORDER — MIDAZOLAM HCL 2 MG/2ML IJ SOLN
INTRAMUSCULAR | Status: AC
  Filled 2021-02-05: qty 2

## 2021-02-05 MED ORDER — BUPIVACAINE-EPINEPHRINE (PF) 0.5% -1:200000 IJ SOLN
INTRAMUSCULAR | Status: AC
  Filled 2021-02-05: qty 30

## 2021-02-05 MED ORDER — MIDAZOLAM HCL 2 MG/2ML IJ SOLN
2.0000 mg | Freq: Once | INTRAMUSCULAR | Status: AC
  Administered 2021-02-05: 2 mg via INTRAVENOUS

## 2021-02-05 MED ORDER — DEXAMETHASONE SODIUM PHOSPHATE 10 MG/ML IJ SOLN
INTRAMUSCULAR | Status: DC | PRN
  Administered 2021-02-05: 4 mg via INTRAVENOUS

## 2021-02-05 MED ORDER — PROPOFOL 500 MG/50ML IV EMUL
INTRAVENOUS | Status: DC | PRN
  Administered 2021-02-05: 25 ug/kg/min via INTRAVENOUS

## 2021-02-05 MED ORDER — SODIUM CHLORIDE 0.9 % IV SOLN: INTRAVENOUS | Status: DC

## 2021-02-05 MED ORDER — PROPOFOL 10 MG/ML IV BOLUS
INTRAVENOUS | Status: DC | PRN
  Administered 2021-02-05: 150 mg via INTRAVENOUS

## 2021-02-05 MED ORDER — FENTANYL CITRATE (PF) 100 MCG/2ML IJ SOLN
100.0000 ug | Freq: Once | INTRAMUSCULAR | Status: AC
  Administered 2021-02-05: 50 ug via INTRAVENOUS

## 2021-02-05 MED ORDER — VANCOMYCIN HCL 500 MG IV SOLR
INTRAVENOUS | Status: DC | PRN
  Administered 2021-02-05: 500 mg via TOPICAL

## 2021-02-05 MED ORDER — ACETAMINOPHEN 10 MG/ML IV SOLN: 1000.0000 mg | Freq: Once | INTRAVENOUS | Status: DC | PRN

## 2021-02-05 MED ORDER — ONDANSETRON HCL 4 MG/2ML IJ SOLN
INTRAMUSCULAR | Status: DC | PRN
  Administered 2021-02-05: 4 mg via INTRAVENOUS

## 2021-02-05 MED ORDER — CLONIDINE HCL (ANALGESIA) 100 MCG/ML EP SOLN
EPIDURAL | Status: DC | PRN
  Administered 2021-02-05: 100 ug

## 2021-02-05 MED ORDER — LIDOCAINE 2% (20 MG/ML) 5 ML SYRINGE
INTRAMUSCULAR | Status: DC | PRN
  Administered 2021-02-05: 40 mg via INTRAVENOUS

## 2021-02-05 MED ORDER — FENTANYL CITRATE (PF) 100 MCG/2ML IJ SOLN: 25.0000 ug | INTRAMUSCULAR | Status: DC | PRN

## 2021-02-05 MED ORDER — LACTATED RINGERS IV SOLN: INTRAVENOUS | Status: DC

## 2021-02-05 MED ORDER — CEFAZOLIN SODIUM-DEXTROSE 2-4 GM/100ML-% IV SOLN
2.0000 g | INTRAVENOUS | Status: AC
Start: 2021-02-05 — End: 2021-02-05
  Administered 2021-02-05: 2 g via INTRAVENOUS

## 2021-02-05 MED ORDER — DEXMEDETOMIDINE (PRECEDEX) IN NS 20 MCG/5ML (4 MCG/ML) IV SYRINGE
PREFILLED_SYRINGE | INTRAVENOUS | Status: DC | PRN
  Administered 2021-02-05: 12 ug via INTRAVENOUS

## 2021-02-05 MED ORDER — CEFAZOLIN SODIUM-DEXTROSE 2-4 GM/100ML-% IV SOLN
INTRAVENOUS | Status: AC
  Filled 2021-02-05: qty 100

## 2021-02-05 MED ORDER — SENNA 8.6 MG PO TABS: 2.0000 | ORAL_TABLET | Freq: Two times a day (BID) | ORAL | 0 refills | Status: DC

## 2021-02-05 SURGICAL SUPPLY — 85 items
APL PRP STRL LF DISP 70% ISPRP (MISCELLANEOUS) ×1
APL SKNCLS STERI-STRIP NONHPOA (GAUZE/BANDAGES/DRESSINGS)
BANDAGE ESMARK 6X9 LF (GAUZE/BANDAGES/DRESSINGS) IMPLANT
BENZOIN TINCTURE PRP APPL 2/3 (GAUZE/BANDAGES/DRESSINGS) IMPLANT
BIT DRILL CANN AO HCS 2.0X65 (BIT) ×2 IMPLANT
BLADE AVERAGE 25X9 (BLADE) IMPLANT
BLADE LONG MED 25X9 (BLADE) ×2 IMPLANT
BLADE OSC/SAG .038X5.5 CUT EDG (BLADE) IMPLANT
BLADE SURG 15 STRL LF DISP TIS (BLADE) ×3 IMPLANT
BLADE SURG 15 STRL SS (BLADE) ×6
BNDG CMPR 9X4 STRL LF SNTH (GAUZE/BANDAGES/DRESSINGS)
BNDG CMPR 9X6 STRL LF SNTH (GAUZE/BANDAGES/DRESSINGS)
BNDG COHESIVE 4X5 TAN ST LF (GAUZE/BANDAGES/DRESSINGS) ×2 IMPLANT
BNDG COHESIVE 6X5 TAN ST LF (GAUZE/BANDAGES/DRESSINGS) IMPLANT
BNDG CONFORM 2 STRL LF (GAUZE/BANDAGES/DRESSINGS) IMPLANT
BNDG CONFORM 3 STRL LF (GAUZE/BANDAGES/DRESSINGS) ×4 IMPLANT
BNDG ELASTIC 4X5.8 VLCR STR LF (GAUZE/BANDAGES/DRESSINGS) IMPLANT
BNDG ESMARK 4X9 LF (GAUZE/BANDAGES/DRESSINGS) IMPLANT
BNDG ESMARK 6X9 LF (GAUZE/BANDAGES/DRESSINGS)
CAP PIN PROTECTOR ORTHO WHT (CAP) IMPLANT
CHLORAPREP W/TINT 26 (MISCELLANEOUS) ×2 IMPLANT
COVER BACK TABLE 60X90IN (DRAPES) ×2 IMPLANT
CUFF TOURN SGL QUICK 34 (TOURNIQUET CUFF)
CUFF TRNQT CYL 34X4.125X (TOURNIQUET CUFF) IMPLANT
DECANTER SPIKE VIAL GLASS SM (MISCELLANEOUS) IMPLANT
DRAPE C-ARM 42X72 X-RAY (DRAPES) IMPLANT
DRAPE EXTREMITY T 121X128X90 (DISPOSABLE) ×2 IMPLANT
DRAPE OEC MINIVIEW 54X84 (DRAPES) ×2 IMPLANT
DRAPE SURG 17X23 STRL (DRAPES) IMPLANT
DRAPE U-SHAPE 47X51 STRL (DRAPES) ×2 IMPLANT
DRSG MEPITEL 4X7.2 (GAUZE/BANDAGES/DRESSINGS) ×2 IMPLANT
DRSG PAD ABDOMINAL 8X10 ST (GAUZE/BANDAGES/DRESSINGS) ×2 IMPLANT
ELECT REM PT RETURN 9FT ADLT (ELECTROSURGICAL) ×2
ELECTRODE REM PT RTRN 9FT ADLT (ELECTROSURGICAL) ×1 IMPLANT
GAUZE SPONGE 4X4 12PLY STRL (GAUZE/BANDAGES/DRESSINGS) ×2 IMPLANT
GLOVE SRG 8 PF TXTR STRL LF DI (GLOVE) ×2 IMPLANT
GLOVE SURG ENC MOIS LTX SZ8 (GLOVE) ×2 IMPLANT
GLOVE SURG LTX SZ8 (GLOVE) ×2 IMPLANT
GLOVE SURG POLYISO LF SZ7 (GLOVE) ×2 IMPLANT
GLOVE SURG UNDER POLY LF SZ7 (GLOVE) ×4 IMPLANT
GLOVE SURG UNDER POLY LF SZ8 (GLOVE) ×4
GOWN STRL REUS W/ TWL LRG LVL3 (GOWN DISPOSABLE) ×1 IMPLANT
GOWN STRL REUS W/ TWL XL LVL3 (GOWN DISPOSABLE) ×2 IMPLANT
GOWN STRL REUS W/TWL LRG LVL3 (GOWN DISPOSABLE) ×2
GOWN STRL REUS W/TWL XL LVL3 (GOWN DISPOSABLE) ×4
K-WIRE .054X4 (WIRE) IMPLANT
K-WIRE DBL END .054 LG (WIRE) ×6 IMPLANT
K-WIRE HCS 0.9X70 (WIRE) ×2
KWIRE HCS 0.9X70 (WIRE) ×1 IMPLANT
NEEDLE HYPO 22GX1.5 SAFETY (NEEDLE) IMPLANT
NS IRRIG 1000ML POUR BTL (IV SOLUTION) ×2 IMPLANT
PACK BASIN DAY SURGERY FS (CUSTOM PROCEDURE TRAY) ×2 IMPLANT
PAD CAST 4YDX4 CTTN HI CHSV (CAST SUPPLIES) ×1 IMPLANT
PADDING CAST ABS 4INX4YD NS (CAST SUPPLIES)
PADDING CAST ABS COTTON 4X4 ST (CAST SUPPLIES) IMPLANT
PADDING CAST COTTON 4X4 STRL (CAST SUPPLIES) ×2
PADDING CAST COTTON 6X4 STRL (CAST SUPPLIES) IMPLANT
PASSER SUT SWANSON 36MM LOOP (INSTRUMENTS) IMPLANT
PENCIL SMOKE EVACUATOR (MISCELLANEOUS) ×2 IMPLANT
SANITIZER HAND PURELL 535ML FO (MISCELLANEOUS) ×2 IMPLANT
SCREW HCS COMPR 2.5X12MM (Screw) ×2 IMPLANT
SCREW HCS TWIST-OFF 2.0X12MM (Screw) ×4 IMPLANT
SHEET MEDIUM DRAPE 40X70 STRL (DRAPES) ×2 IMPLANT
SLEEVE SCD COMPRESS KNEE MED (STOCKING) ×2 IMPLANT
SPLINT FAST PLASTER 5X30 (CAST SUPPLIES)
SPLINT PLASTER CAST FAST 5X30 (CAST SUPPLIES) IMPLANT
SPONGE T-LAP 18X18 ~~LOC~~+RFID (SPONGE) ×2 IMPLANT
STOCKINETTE 6  STRL (DRAPES) ×1
STOCKINETTE 6 STRL (DRAPES) ×1 IMPLANT
STRIP CLOSURE SKIN 1/2X4 (GAUZE/BANDAGES/DRESSINGS) IMPLANT
SUCTION FRAZIER HANDLE 10FR (MISCELLANEOUS) ×1
SUCTION TUBE FRAZIER 10FR DISP (MISCELLANEOUS) ×1 IMPLANT
SUT ETHILON 3 0 PS 1 (SUTURE) ×2 IMPLANT
SUT MNCRL AB 3-0 PS2 18 (SUTURE) ×2 IMPLANT
SUT VIC AB 2-0 SH 18 (SUTURE) IMPLANT
SUT VIC AB 2-0 SH 27 (SUTURE) ×2
SUT VIC AB 2-0 SH 27XBRD (SUTURE) ×1 IMPLANT
SUT VICRYL 0 SH 27 (SUTURE) IMPLANT
SUT VICRYL 0 UR6 27IN ABS (SUTURE) ×2 IMPLANT
SYR BULB EAR ULCER 3OZ GRN STR (SYRINGE) ×2 IMPLANT
SYR CONTROL 10ML LL (SYRINGE) IMPLANT
TOWEL GREEN STERILE FF (TOWEL DISPOSABLE) ×4 IMPLANT
TUBE CONNECTING 20X1/4 (TUBING) ×2 IMPLANT
UNDERPAD 30X36 HEAVY ABSORB (UNDERPADS AND DIAPERS) ×2 IMPLANT
YANKAUER SUCT BULB TIP NO VENT (SUCTIONS) IMPLANT

## 2021-02-05 NOTE — Anesthesia Procedure Notes (Signed)
Anesthesia Regional Block: Adductor canal block   Pre-Anesthetic Checklist: , timeout performed,  Correct Patient, Correct Site, Correct Laterality,  Correct Procedure, Correct Position, site marked,  Risks and benefits discussed,  Surgical consent,  Pre-op evaluation,  At surgeon's request and post-op pain management  Laterality: Right  Prep: Dura Prep       Needles:  Injection technique: Single-shot  Needle Type: Echogenic Stimulator Needle     Needle Length: 10cm  Needle Gauge: 20     Additional Needles:   Procedures:,,,, ultrasound used (permanent image in chart),,    Narrative:  Start time: 02/05/2021 7:08 AM End time: 02/05/2021 7:10 AM Injection made incrementally with aspirations every 5 mL.  Performed by: Personally  Anesthesiologist: Atilano Median, DO  Additional Notes: Patient identified. Risks/Benefits/Options discussed with patient including but not limited to bleeding, infection, nerve damage, failed block, incomplete pain control. Patient expressed understanding and wished to proceed. All questions were answered. Sterile technique was used throughout the entire procedure. Please see nursing notes for vital signs. Aspirated in 5cc intervals with injection for negative confirmation. Patient was given instructions on fall risk and not to get out of bed. All questions and concerns addressed with instructions to call with any issues or inadequate analgesia.

## 2021-02-05 NOTE — Transfer of Care (Signed)
Immediate Anesthesia Transfer of Care Note  Patient: [name], [employer]  Procedure(s) Performed: Removal of deep implants 2-3 metatarsal (Right: Foot) 3-4 Weil osteotomy, revision hammertoe correction  2-4, correction of tailer's bunion (Right: Foot)  Patient Location: PACU  Anesthesia Type:GA combined with regional for post-op pain  Level of Consciousness: drowsy and patient cooperative  Airway & Oxygen Therapy: Patient Spontanous Breathing and Patient connected to face mask oxygen  Post-op Assessment: Report given to RN and Post -op Vital signs reviewed and stable  Post vital signs: Reviewed and stable  Last Vitals:  Vitals Value Taken Time  BP 131/74 02/05/21 0900  Temp    Pulse 70 02/05/21 0901  Resp 20 02/05/21 0901  SpO2 99 % 02/05/21 0901  Vitals shown include unvalidated device data.  Last Pain:  Vitals:   02/05/21 0636  TempSrc: Oral  PainSc: 0-No pain      Patients Stated Pain Goal: 5 (02/05/21 0636)  Complications: No notable events documented.

## 2021-02-05 NOTE — Anesthesia Postprocedure Evaluation (Signed)
Anesthesia Post Note  Patient: [name], newspaper  Procedure(s) Performed: Removal of deep implants 2-3 metatarsal (Right: Foot) 3-4 Weil osteotomy and revision hammertoe correction; 2-3 MTP joint dorsal capsulotomy and extensor tendon lengthening; correction of tailer's bunion (Right: Foot)     Patient location during evaluation: PACU Anesthesia Type: Regional and General Level of consciousness: awake and alert Pain management: pain level controlled Vital Signs Assessment: post-procedure vital signs reviewed and stable Respiratory status: spontaneous breathing, nonlabored ventilation, respiratory function stable and patient connected to nasal cannula oxygen Cardiovascular status: blood pressure returned to baseline and stable Postop Assessment: no apparent nausea or vomiting Anesthetic complications: no   No notable events documented.  Last Vitals:  Vitals:   02/05/21 0915 02/05/21 0944  BP: 131/81 127/74  Pulse: 71 63  Resp: 12 18  Temp:  36.5 C  SpO2: 100% 100%    Last Pain:  Vitals:   02/05/21 0944  TempSrc: Oral  PainSc: 1                  Gina Montgomery P Gina Montgomery

## 2021-02-05 NOTE — Anesthesia Procedure Notes (Signed)
Procedure Name: LMA Insertion Date/Time: 02/05/2021 7:37 AM Performed by: Deannie Resetar, Jewel Baize, CRNA Pre-anesthesia Checklist: Patient identified, Emergency Drugs available, Suction available and Patient being monitored Patient Re-evaluated:Patient Re-evaluated prior to induction Oxygen Delivery Method: Circle System Utilized Preoxygenation: Pre-oxygenation with 100% oxygen Induction Type: IV induction Ventilation: Mask ventilation without difficulty LMA: LMA inserted LMA Size: 4.0 Number of attempts: 1 Airway Equipment and Method: bite block Placement Confirmation: positive ETCO2 Tube secured with: Tape Dental Injury: Teeth and Oropharynx as per pre-operative assessment

## 2021-02-05 NOTE — Anesthesia Procedure Notes (Signed)
Anesthesia Regional Block: Popliteal block   Pre-Anesthetic Checklist: , timeout performed,  Correct Patient, Correct Site, Correct Laterality,  Correct Procedure, Correct Position, site marked,  Risks and benefits discussed,  Surgical consent,  Pre-op evaluation,  At surgeon's request and post-op pain management  Laterality: Right  Prep: Dura Prep       Needles:  Injection technique: Single-shot  Needle Type: Echogenic Stimulator Needle     Needle Length: 10cm  Needle Gauge: 20     Additional Needles:   Procedures:,,,, ultrasound used (permanent image in chart),,    Narrative:  Start time: 02/05/2021 7:11 AM End time: 02/05/2021 7:13 AM Injection made incrementally with aspirations every 5 mL.  Performed by: Personally  Anesthesiologist: Atilano Median, DO  Additional Notes: Patient identified. Risks/Benefits/Options discussed with patient including but not limited to bleeding, infection, nerve damage, failed block, incomplete pain control. Patient expressed understanding and wished to proceed. All questions were answered. Sterile technique was used throughout the entire procedure. Please see nursing notes for vital signs. Aspirated in 5cc intervals with injection for negative confirmation. Patient was given instructions on fall risk and not to get out of bed. All questions and concerns addressed with instructions to call with any issues or inadequate analgesia.

## 2021-02-05 NOTE — Progress Notes (Signed)
Assisted Dr. Gavin Potters with right, ultrasound guided, popliteal and adductor canal blocks. Side rails up, monitors on throughout procedure. See vital signs in flow sheet. Tolerated Procedure well.

## 2021-02-05 NOTE — H&P (Signed)
Gina Montgomery is an 55 y.o. female.   Chief Complaint: Right foot pain HPI: 55 year old female with past medical history significant for smoking complains of right forefoot pain that has progressively gotten worse since she had reconstructive surgery back in 2009.  She has developed recurrent hammertoe deformities as well as a tailor's bunion.  She also has painful hardware at her second and third metatarsals.  She has failed nonoperative treatment to date including activity modification, oral anti-inflammatories and shoewear modification.  She presents now for revision surgical treatment of these painful and limiting right forefoot deformities.  Past Medical History:  Diagnosis Date   Anxiety    Depression    High cholesterol    Psoriasis    back of neck    Past Surgical History:  Procedure Laterality Date   ABDOMINAL HYSTERECTOMY     FOOT SURGERY Bilateral    75 bolts each foot    Family History  Problem Relation Age of Onset   Cancer Mother        leukemia   Cancer Maternal Aunt        breast cancer   Cancer Paternal Aunt        breast cancer   Heart disease Maternal Grandmother    Heart disease Maternal Grandfather    Alzheimer's disease Paternal Grandmother    Diabetes Neg Hx    Stroke Neg Hx    Social History:  reports that she has quit smoking. Her smoking use included cigarettes. She has a 9.75 pack-year smoking history. She has never used smokeless tobacco. She reports that she does not drink alcohol and does not use drugs.  Allergies: No Known Allergies  Medications Prior to Admission  Medication Sig Dispense Refill   cholecalciferol (VITAMIN D3) 25 MCG (1000 UNIT) tablet Take 1,000 Units by mouth daily.     hydrOXYzine (ATARAX/VISTARIL) 25 MG tablet Take 25 mg by mouth 3 (three) times daily.     venlafaxine (EFFEXOR) 75 MG tablet Take 75 mg by mouth every morning.     tiZANidine (ZANAFLEX) 4 MG tablet Take 4 mg by mouth every 6 (six) hours as needed for muscle  spasms.      No results found for this or any previous visit (from the past 48 hour(s)). DG MINI C-ARM IMAGE ONLY  Result Date: 02/05/2021 There is no interpretation for this exam.  This order is for images obtained during a surgical procedure.  Please See "Surgeries" Tab for more information regarding the procedure.    Review of Systems no recent fever, chills, nausea, vomiting or changes in her appetite  Blood pressure 125/71, pulse 64, temperature (!) 97 F (36.1 C), temperature source Oral, resp. rate 14, height 5\' 8"  (1.727 m), weight 86.8 kg, SpO2 99 %. Physical Exam  Well-nourished well-developed woman in no apparent distress.  Alert and oriented x4.  Normal mood and affect.  Extraocular motions are intact.  Respirations are unlabored.  Gait is antalgic to the right.  The right foot has a prominent tailor's bunion as well as second and third hammertoe deformities with dorsal extensor tendon contractures.  The fourth toe laps under the third and is rotationally unstable.  Surgical incisions are all healed.  No signs of infection.  Toes are all well perfused with brisk capillary refill.   Assessment/Plan Right second through fourth hammertoe deformities and tailor's bunion with painful hardware at the second and third metatarsals -to the operating room today for revision surgical treatment.  The risks and benefits  of the alternative treatment options have been discussed in detail.  The patient wishes to proceed with surgery and specifically understands risks of bleeding, infection, nerve damage, blood clots, need for additional surgery, amputation and death.   Toni Arthurs, MD 02-06-2021, 7:25 AM

## 2021-02-05 NOTE — Op Note (Signed)
02/05/2021  9:05 AM  PATIENT:  Gina Montgomery  55 y.o. female  PRE-OPERATIVE DIAGNOSIS: 1.  Right forefoot metatarsalgia 2.  Painful hardware at the right second and third metatarsals 3.  Recurrent right second and third hammertoe deformities 4.  Right fourth hammertoe deformity 5.  Right tailor's bunion  POST-OPERATIVE DIAGNOSIS: Same  Procedure(s): 1.  Removal of deep implants from the right second and third metatarsals 2.  Right second and third MTP joint dorsal capsulotomy and extensor tendon lengthening 3.  Right third and fourth metatarsal Weil osteotomies 4.  Right fourth MTP joint lateral collateral ligament repair 5.  Correction of right fifth metatarsal tailor's bunion with long chevron osteotomy 6.  Right foot AP and lateral radiographs  SURGEON:  Toni Arthurs, MD  ASSISTANT: Alfredo Martinez, PA-C  ANESTHESIA:   General, regional  EBL:  minimal   TOURNIQUET:   Total Tourniquet Time Documented: Thigh (Right) - 67 minutes Total: Thigh (Right) - 67 minutes  COMPLICATIONS:  None apparent  DISPOSITION:  Extubated, awake and stable to recovery.  INDICATION FOR PROCEDURE: The patient is a 55 year old female with a long history of right forefoot pain.  She underwent forefoot [place] city.  She has experienced recurrence of her second and third hammertoe deformity and is also developed a painful fourth hammertoe deformity.  She has painful hardware at the second and third metatarsals and a prominent tailor's bunion.  She has failed nonoperative treatment to date and presents for surgical correction of these painful forefoot conditions.  The risks and benefits of the alternative treatment options have been discussed in detail.  The patient wishes to proceed with surgery and specifically understands risks of bleeding, infection, nerve damage, blood clots, need for additional surgery, amputation and death.   PROCEDURE IN DETAIL:  After pre operative  consent was obtained, and the correct operative site was identified, the patient was brought to the operating room and placed supine on the OR table.  Anesthesia was administered.  Pre-operative antibiotics were administered.  A surgical timeout was taken.  The right lower extremity was prepped and draped in standard sterile fashion with a tourniquet around the thigh.  The extremity was elevated and the tourniquet was inflated to 250 mmHg.  The previous second webspace incision was identified.  It was opened again sharply and dissection carried down through the subcutaneous tissues.  The second MTP joint was identified.  The extensor tendons were lengthened in Z fashion and the dorsal joint capsule excised in its entirety.  That corrected the recurrent hammertoe deformity.  The prominent screw was identified at the second metatarsal head.  The screw was removed without difficulty.  It was noted to be grossly loose.  The same 2 procedures were performed for the third MTP joint including lengthening the extensors and excising the dorsal capsule to correct the hammertoe deformity as well as removing the painful screw from the third metatarsal.  A Weil osteotomy was then performed at the third metatarsal shortening it appropriately.  The osteotomy was fixed with a 2 mm Zimmer [employer] FRS screw.  Attention was turned to the fourth MTP joint where an incision was made lateral to the joint.  Dissection was carried down through the subcutaneous tissues.  The metatarsal head was exposed.  A Weil osteotomy was performed shortening the fourth metatarsal appropriately.  A wedge of bone was removed.  The osteotomy was fixed with a 2 mm FRS screw.  Overhanging bone was trimmed with a rondure.  The lateral collateral ligaments were exposed.  A box suture of 0 Vicryl was placed in the ligament and tagged for later tying.  Attention was turned to the lateral forefoot where an incision was made along the fifth MTP joint.   Dissection was carried down to the subcutaneous tissues.  The joint capsule was incised and elevated dorsally and plantarly exposing the metatarsal head.  A long chevron osteotomy was made with the oscillating saw.  The head of the metatarsal was translated medially correcting the tailor's bunion.  The osteotomy was fixed with a variable pitch FRS screw.  AP and lateral radiographs showed appropriate correction of the 4-5 intermetatarsal angle.  Appropriate position of the screw was also noted.  A 0.054 K wire was then inserted into the tip of the fourth toe and advanced across the IP joints.  The MP joint was reduced and the wire advanced into the fourth metatarsal.  It was advanced to the base of the metatarsal.  AP and lateral radiographs confirmed appropriate reduction of the fourth MTP joint and appropriate position and length of the wire.  The wire was then bent, trimmed and capped.  The fifth toe was also reduced and pinned as described above.  The screw had to be repositioned to allow appropriate passage of the pin.  AP and lateral radiographs confirmed appropriate position of the pin and appropriate correction of the 4-5 intermetatarsal angle with appropriate position of the screw.  The pin was then bent, trimmed and capped.  Attention was returned to the fourth MTP joint where the lateral collateral ligament suture was tied securely.  Final AP and lateral radiographs confirmed appropriate position and length of all hardware and appropriate correction of the forefoot deformities.  The extensor tendons were repaired after irrigating the wounds copiously.  Vancomycin powder were sprinkled in the wounds.  Subcutaneous tissues were approximated with Monocryl.  Skin incisions were closed with nylon.  Sterile dressings were applied followed by a compression wrap.  The tourniquet was released after application of the dressings.  The patient was awakened from anesthesia and transported to the recovery room in  stable condition.   FOLLOW UP PLAN: Weightbearing as tolerated on the heel in a Darco shoe.  Follow-up in the office in 2 weeks for suture removal.  Plan to pull the pins 6 weeks postop.   RADIOGRAPHS: AP and lateral radiographs of the right foot are obtained intraoperatively.  These show interval correction of the hammertoe deformities and interval hardware removal from the second and third metatarsals.  Third and fourth metatarsal osteotomies are noted.  Fifth metatarsal osteotomy is also noted.  Hardware is appropriately positioned and of the appropriate lengths.  No other acute injuries are noted.    Alfredo Martinez PA-C was present and scrubbed for the duration of the operative case. His assistance was essential in positioning the patient, prepping and draping, gaining and maintaining exposure, performing the operation, closing and dressing the wounds and applying the splint.

## 2021-02-05 NOTE — Anesthesia Preprocedure Evaluation (Addendum)
Anesthesia Evaluation  Patient identified by MRN, date of birth, ID band Patient awake    Reviewed: Allergy & Precautions, NPO status , Patient's Chart, lab work & pertinent test results  Airway Mallampati: II  TM Distance: >3 FB Neck ROM: Full    Dental  (+) Upper Dentures, Partial Lower   Pulmonary neg pulmonary ROS, Patient abstained from smoking., former smoker,    Pulmonary exam normal        Cardiovascular negative cardio ROS   Rhythm:Regular Rate:Normal     Neuro/Psych Anxiety Depression negative neurological ROS     GI/Hepatic negative GI ROS, Neg liver ROS,   Endo/Other  negative endocrine ROS  Renal/GU negative Renal ROS  negative genitourinary   Musculoskeletal  (+) Arthritis , Osteoarthritis,    Abdominal (+)  Abdomen: soft.    Peds  Hematology negative hematology ROS (+)   Anesthesia Other Findings   Reproductive/Obstetrics                            Anesthesia Physical Anesthesia Plan  ASA: 2  Anesthesia Plan: General and Regional   Post-op Pain Management:  Regional for Post-op pain   Induction: Intravenous  PONV Risk Score and Plan: 3 and Ondansetron, Dexamethasone, Midazolam and Treatment may vary due to age or medical condition  Airway Management Planned: Mask and LMA  Additional Equipment: None  Intra-op Plan:   Post-operative Plan: Extubation in OR  Informed Consent: I have reviewed the patients History and Physical, chart, labs and discussed the procedure including the risks, benefits and alternatives for the proposed anesthesia with the patient or authorized representative who has indicated his/her understanding and acceptance.     Dental advisory given  Plan Discussed with:   Anesthesia Plan Comments:         Anesthesia Quick Evaluation

## 2021-02-05 NOTE — Discharge Instructions (Addendum)
Toni Arthurs, MD [facility]  Please read the following information regarding your care after surgery.  Medications  You only need a prescription for the narcotic pain medicine (ex. oxycodone, Percocet, Norco).  All of the other medicines listed below are available over the counter. X Aleve 2 pills twice a day for the first 3 days after surgery. X acetominophen (Tylenol) 650 mg every 4-6 hours as you need for minor to moderate pain X oxycodone as prescribed for severe pain  Narcotic pain medicine (ex. oxycodone, Percocet, Vicodin) will cause constipation.  To prevent this problem, take the following medicines while you are taking any pain medicine. X docusate sodium (Colace) 100 mg twice a day X senna (Senokot) 2 tablets twice a day  Weight Bearing X Bear weight only on your heel on your operated foot in the post-op shoe.   Cast / Splint / Dressing X Keep your splint, cast or dressing clean and dry.  Don't put anything (coat hanger, pencil, etc) down inside of it.  If it gets damp, use a hair dryer on the cool setting to dry it.  If it gets soaked, call the office to schedule an appointment for a cast change.   After your dressing, cast or splint is removed; you may shower, but do not soak or scrub the wound.  Allow the water to run over it, and then gently pat it dry.  Swelling It is normal for you to have swelling where you had surgery.  To reduce swelling and pain, keep your toes above your nose for at least 3 days after surgery.  It may be necessary to keep your foot or leg elevated for several weeks.  If it hurts, it should be elevated.  Follow Up Call my office at [phone] when you are discharged from the hospital or surgery center to schedule an appointment to be seen two weeks after surgery.  Call my office at [phone] if you develop a fever >101.5 F, nausea, vomiting, bleeding from the surgical site or severe pain.     Post Anesthesia Home Care  Instructions  Activity: Get plenty of rest for the remainder of the day. A responsible individual must stay with you for 24 hours following the procedure.  For the next 24 hours, DO NOT: -Drive a car -Advertising copywriter -Drink alcoholic beverages -Take any medication unless instructed by your physician -Make any legal decisions or sign important papers.  Meals: Start with liquid foods such as gelatin or soup. Progress to regular foods as tolerated. Avoid greasy, spicy, heavy foods. If nausea and/or vomiting occur, drink only clear liquids until the nausea and/or vomiting subsides. Call your physician if vomiting continues.  Special Instructions/Symptoms: Your throat may feel dry or sore from the anesthesia or the breathing tube placed in your throat during surgery. If this causes discomfort, gargle with warm salt water. The discomfort should disappear within 24 hours.  If you had a scopolamine patch placed behind your ear for the management of post- operative nausea and/or vomiting:  1. The medication in the patch is effective for 72 hours, after which it should be removed.  Wrap patch in a tissue and discard in the trash. Wash hands thoroughly with soap and water. 2. You may remove the patch earlier than 72 hours if you experience unpleasant side effects which may include dry mouth, dizziness or visual disturbances. 3. Avoid touching the patch. Wash your hands with soap and water after contact with the patch.     Regional  Anesthesia Blocks  1. Numbness or the inability to move the "blocked" extremity may last from 3-48 hours after placement. The length of time depends on the medication injected and your individual response to the medication. If the numbness is not going away after 48 hours, call your surgeon.  2. The extremity that is blocked will need to be protected until the numbness is gone and the  Strength has returned. Because you cannot feel it, you will need to take extra care to  avoid injury. Because it may be weak, you may have difficulty moving it or using it. You may not know what position it is in without looking at it while the block is in effect.  3. For blocks in the legs and feet, returning to weight bearing and walking needs to be done carefully. You will need to wait until the numbness is entirely gone and the strength has returned. You should be able to move your leg and foot normally before you try and bear weight or walk. You will need someone to be with you when you first try to ensure you do not fall and possibly risk injury.  4. Bruising and tenderness at the needle site are common side effects and will resolve in a few days.  5. Persistent numbness or new problems with movement should be communicated to the surgeon or the [facility] [phone] [facility] [phone]).

## 2021-02-06 ENCOUNTER — Encounter (HOSPITAL_BASED_OUTPATIENT_CLINIC_OR_DEPARTMENT_OTHER): Payer: Self-pay | Admitting: Orthopedic Surgery

## 2021-03-02 ENCOUNTER — Other Ambulatory Visit: Payer: Self-pay

## 2021-03-02 ENCOUNTER — Ambulatory Visit
Admission: RE | Admit: 2021-03-02 | Discharge: 2021-03-02 | Disposition: A | Discharge status: Home / Self Care | Payer: 59 | Source: Ambulatory Visit | Attending: Otolaryngology | Admitting: Otolaryngology

## 2021-03-02 DIAGNOSIS — R221 Localized swelling, mass and lump, neck: Secondary | ICD-10-CM | POA: Diagnosis not present

## 2021-03-02 MED ORDER — IOHEXOL 300 MG/ML  SOLN
75.0000 mL | Freq: Once | INTRAMUSCULAR | Status: AC | PRN
  Administered 2021-03-02: 75 mL via INTRAVENOUS

## 2021-03-23 ENCOUNTER — Other Ambulatory Visit (HOSPITAL_COMMUNITY): Payer: Self-pay | Admitting: Orthopedic Surgery

## 2021-04-27 ENCOUNTER — Other Ambulatory Visit: Payer: Self-pay

## 2021-04-27 ENCOUNTER — Encounter (HOSPITAL_BASED_OUTPATIENT_CLINIC_OR_DEPARTMENT_OTHER): Payer: Self-pay | Admitting: Orthopedic Surgery

## 2021-05-05 ENCOUNTER — Other Ambulatory Visit (HOSPITAL_COMMUNITY): Payer: Self-pay | Admitting: Orthopedic Surgery

## 2021-05-06 NOTE — Progress Notes (Signed)
Called pt to come pick up Ensure presurgery drink. Pt will finish by 0445 tomorrow morning. Pt verbalized understanding and will be by today.

## 2021-05-06 NOTE — Progress Notes (Signed)
Ensure presurgery drink given to pt with instruction to finish drink by 0445 DOS. Pt verbalized understanding       Enhanced Recovery after Surgery for Orthopedics Enhanced Recovery after Surgery is a protocol used to improve the stress on your body and your recovery after surgery.  Patient Instructions  The night before surgery:  No food after midnight. ONLY clear liquids after midnight  The day of surgery (if you do NOT have diabetes):  Drink ONE (1) Pre-Surgery Clear Ensure as directed.   This drink was given to you during your hospital  pre-op appointment visit. The pre-op nurse will instruct you on the time to drink the  Pre-Surgery Ensure depending on your surgery time. Finish the drink at the designated time by the pre-op nurse.  Nothing else to drink after completing the  Pre-Surgery Clear Ensure.  The day of surgery (if you have diabetes): Drink ONE (1) Gatorade 2 (G2) as directed. This drink was given to you during your hospital  pre-op appointment visit.  The pre-op nurse will instruct you on the time to drink the   Gatorade 2 (G2) depending on your surgery time. Color of the Gatorade may vary. Red is not allowed. Nothing else to drink after completing the  Gatorade 2 (G2).         If you have questions, please contact your surgeons office.

## 2021-05-07 ENCOUNTER — Encounter (HOSPITAL_BASED_OUTPATIENT_CLINIC_OR_DEPARTMENT_OTHER)
Admission: RE | Disposition: A | Discharge status: Home / Self Care | Payer: Self-pay | Source: Home / Self Care | Attending: Orthopedic Surgery

## 2021-05-07 ENCOUNTER — Ambulatory Visit (HOSPITAL_BASED_OUTPATIENT_CLINIC_OR_DEPARTMENT_OTHER): Payer: BC Managed Care – PPO

## 2021-05-07 ENCOUNTER — Encounter (HOSPITAL_BASED_OUTPATIENT_CLINIC_OR_DEPARTMENT_OTHER): Payer: Self-pay | Admitting: Orthopedic Surgery

## 2021-05-07 ENCOUNTER — Ambulatory Visit (HOSPITAL_BASED_OUTPATIENT_CLINIC_OR_DEPARTMENT_OTHER)
Admission: RE | Admit: 2021-05-07 | Discharge: 2021-05-07 | Disposition: A | Discharge status: Home / Self Care | Payer: BC Managed Care – PPO | Attending: Orthopedic Surgery | Admitting: Orthopedic Surgery

## 2021-05-07 ENCOUNTER — Other Ambulatory Visit: Payer: Self-pay

## 2021-05-07 ENCOUNTER — Ambulatory Visit (HOSPITAL_BASED_OUTPATIENT_CLINIC_OR_DEPARTMENT_OTHER): Payer: BC Managed Care – PPO | Admitting: Anesthesiology

## 2021-05-07 DIAGNOSIS — G8918 Other acute postprocedural pain: Secondary | ICD-10-CM | POA: Diagnosis not present

## 2021-05-07 DIAGNOSIS — M79671 Pain in right foot: Secondary | ICD-10-CM

## 2021-05-07 DIAGNOSIS — M7742 Metatarsalgia, left foot: Secondary | ICD-10-CM | POA: Diagnosis not present

## 2021-05-07 DIAGNOSIS — Z87891 Personal history of nicotine dependence: Secondary | ICD-10-CM | POA: Diagnosis not present

## 2021-05-07 DIAGNOSIS — T8484XA Pain due to internal orthopedic prosthetic devices, implants and grafts, initial encounter: Secondary | ICD-10-CM | POA: Insufficient documentation

## 2021-05-07 DIAGNOSIS — M2042 Other hammer toe(s) (acquired), left foot: Secondary | ICD-10-CM | POA: Diagnosis not present

## 2021-05-07 DIAGNOSIS — Y831 Surgical operation with implant of artificial internal device as the cause of abnormal reaction of the patient, or of later complication, without mention of misadventure at the time of the procedure: Secondary | ICD-10-CM | POA: Insufficient documentation

## 2021-05-07 DIAGNOSIS — F32A Depression, unspecified: Secondary | ICD-10-CM | POA: Diagnosis not present

## 2021-05-07 DIAGNOSIS — M21622 Bunionette of left foot: Secondary | ICD-10-CM | POA: Diagnosis not present

## 2021-05-07 DIAGNOSIS — M24575 Contracture, left foot: Secondary | ICD-10-CM | POA: Diagnosis not present

## 2021-05-07 DIAGNOSIS — F419 Anxiety disorder, unspecified: Secondary | ICD-10-CM | POA: Diagnosis not present

## 2021-05-07 HISTORY — PX: BUNIONECTOMY WITH HAMMERTOE RECONSTRUCTION: SHX5600

## 2021-05-07 HISTORY — PX: HARDWARE REMOVAL: SHX979

## 2021-05-07 SURGERY — REMOVAL, HARDWARE
Anesthesia: General | Site: Foot | Laterality: Left

## 2021-05-07 MED ORDER — SODIUM CHLORIDE 0.9 % IV SOLN: INTRAVENOUS | Status: DC

## 2021-05-07 MED ORDER — PHENYLEPHRINE 40 MCG/ML (10ML) SYRINGE FOR IV PUSH (FOR BLOOD PRESSURE SUPPORT)
PREFILLED_SYRINGE | INTRAVENOUS | Status: AC
  Filled 2021-05-07: qty 20

## 2021-05-07 MED ORDER — OXYCODONE HCL 5 MG PO TABS: 5.0000 mg | ORAL_TABLET | ORAL | 0 refills | Status: AC | PRN

## 2021-05-07 MED ORDER — HYDROMORPHONE HCL 1 MG/ML IJ SOLN: 0.2500 mg | INTRAMUSCULAR | Status: DC | PRN

## 2021-05-07 MED ORDER — DEXAMETHASONE SODIUM PHOSPHATE 10 MG/ML IJ SOLN
INTRAMUSCULAR | Status: DC | PRN
  Administered 2021-05-07: 4 mg via INTRAVENOUS

## 2021-05-07 MED ORDER — LIDOCAINE HCL (CARDIAC) PF 100 MG/5ML IV SOSY
PREFILLED_SYRINGE | INTRAVENOUS | Status: DC | PRN
  Administered 2021-05-07: 30 mg via INTRAVENOUS

## 2021-05-07 MED ORDER — MIDAZOLAM HCL 2 MG/2ML IJ SOLN
2.0000 mg | Freq: Once | INTRAMUSCULAR | Status: AC
  Administered 2021-05-07: 2 mg via INTRAVENOUS

## 2021-05-07 MED ORDER — SODIUM BICARBONATE 8.4 % IV SOLN
INTRAVENOUS | Status: AC
  Filled 2021-05-07: qty 50

## 2021-05-07 MED ORDER — PHENYLEPHRINE HCL (PRESSORS) 10 MG/ML IV SOLN
INTRAVENOUS | Status: DC | PRN
  Administered 2021-05-07: 120 ug via INTRAVENOUS

## 2021-05-07 MED ORDER — ACETAMINOPHEN 500 MG PO TABS
1000.0000 mg | ORAL_TABLET | Freq: Once | ORAL | Status: AC
  Administered 2021-05-07: 1000 mg via ORAL

## 2021-05-07 MED ORDER — 0.9 % SODIUM CHLORIDE (POUR BTL) OPTIME
TOPICAL | Status: DC | PRN
  Administered 2021-05-07: 150 mL

## 2021-05-07 MED ORDER — MIDAZOLAM HCL 2 MG/2ML IJ SOLN
INTRAMUSCULAR | Status: AC
  Filled 2021-05-07: qty 2

## 2021-05-07 MED ORDER — LACTATED RINGERS IV SOLN
INTRAVENOUS | Status: DC
Start: 2021-05-07 — End: 2021-05-07

## 2021-05-07 MED ORDER — ACETAMINOPHEN 500 MG PO TABS
ORAL_TABLET | ORAL | Status: AC
  Filled 2021-05-07: qty 2

## 2021-05-07 MED ORDER — PROPOFOL 10 MG/ML IV BOLUS
INTRAVENOUS | Status: AC
  Filled 2021-05-07: qty 20

## 2021-05-07 MED ORDER — FENTANYL CITRATE (PF) 100 MCG/2ML IJ SOLN
100.0000 ug | Freq: Once | INTRAMUSCULAR | Status: AC
  Administered 2021-05-07: 100 ug via INTRAVENOUS

## 2021-05-07 MED ORDER — FENTANYL CITRATE (PF) 100 MCG/2ML IJ SOLN
INTRAMUSCULAR | Status: AC
  Filled 2021-05-07: qty 2

## 2021-05-07 MED ORDER — CEFAZOLIN SODIUM-DEXTROSE 2-4 GM/100ML-% IV SOLN
INTRAVENOUS | Status: AC
  Filled 2021-05-07: qty 100

## 2021-05-07 MED ORDER — CEFAZOLIN SODIUM-DEXTROSE 2-4 GM/100ML-% IV SOLN
2.0000 g | INTRAVENOUS | Status: AC
  Administered 2021-05-07: 2 g via INTRAVENOUS

## 2021-05-07 MED ORDER — FENTANYL CITRATE (PF) 100 MCG/2ML IJ SOLN
INTRAMUSCULAR | Status: DC | PRN
  Administered 2021-05-07: 25 ug via INTRAVENOUS

## 2021-05-07 MED ORDER — PROPOFOL 500 MG/50ML IV EMUL
INTRAVENOUS | Status: DC | PRN
  Administered 2021-05-07: 25 ug/kg/min via INTRAVENOUS

## 2021-05-07 MED ORDER — ONDANSETRON HCL 4 MG/2ML IJ SOLN
INTRAMUSCULAR | Status: DC | PRN
Start: 2021-05-07 — End: 2021-05-07
  Administered 2021-05-07: 4 mg via INTRAVENOUS

## 2021-05-07 MED ORDER — BUPIVACAINE-EPINEPHRINE (PF) 0.5% -1:200000 IJ SOLN
INTRAMUSCULAR | Status: DC | PRN
  Administered 2021-05-07: 30 mL via PERINEURAL

## 2021-05-07 MED ORDER — VANCOMYCIN HCL 500 MG IV SOLR
INTRAVENOUS | Status: DC | PRN
  Administered 2021-05-07: 500 mg via TOPICAL

## 2021-05-07 MED ORDER — PROPOFOL 10 MG/ML IV BOLUS
INTRAVENOUS | Status: DC | PRN
Start: 2021-05-07 — End: 2021-05-07
  Administered 2021-05-07: 200 mg via INTRAVENOUS

## 2021-05-07 SURGICAL SUPPLY — 82 items
APL PRP STRL LF DISP 70% ISPRP (MISCELLANEOUS) ×1
BANDAGE ESMARK 6X9 LF (GAUZE/BANDAGES/DRESSINGS) IMPLANT
BIT DRILL 1.8 CANN MAX VPC (BIT) ×1 IMPLANT
BIT DRILL CANN 2.4 (BIT) ×2
BIT DRILL CANN MAX VPC 2.4 (BIT) IMPLANT
BLADE AVERAGE 25X9 (BLADE) IMPLANT
BLADE LONG MED 25X9 (BLADE) ×1 IMPLANT
BLADE MICRO SAGITTAL (BLADE) IMPLANT
BLADE MINI RND TIP GREEN BEAV (BLADE) ×1 IMPLANT
BLADE OSC/SAG .038X5.5 CUT EDG (BLADE) IMPLANT
BLADE SURG 15 STRL LF DISP TIS (BLADE) ×2 IMPLANT
BLADE SURG 15 STRL SS (BLADE) ×6
BNDG CMPR 9X6 STRL LF SNTH (GAUZE/BANDAGES/DRESSINGS)
BNDG COHESIVE 4X5 TAN ST LF (GAUZE/BANDAGES/DRESSINGS) ×1 IMPLANT
BNDG CONFORM 3 STRL LF (GAUZE/BANDAGES/DRESSINGS) ×2 IMPLANT
BNDG ELASTIC 4X5.8 VLCR STR LF (GAUZE/BANDAGES/DRESSINGS) ×1 IMPLANT
BNDG ESMARK 6X9 LF (GAUZE/BANDAGES/DRESSINGS)
CHLORAPREP W/TINT 26 (MISCELLANEOUS) ×2 IMPLANT
COVER BACK TABLE 60X90IN (DRAPES) ×2 IMPLANT
CUFF TOURN SGL QUICK 24 (TOURNIQUET CUFF)
CUFF TOURN SGL QUICK 30 NS (TOURNIQUET CUFF) ×1 IMPLANT
CUFF TOURN SGL QUICK 34 (TOURNIQUET CUFF)
CUFF TRNQT CYL 24X4X16.5-23 (TOURNIQUET CUFF) IMPLANT
CUFF TRNQT CYL 34X4.125X (TOURNIQUET CUFF) IMPLANT
DRAPE EXTREMITY T 121X128X90 (DISPOSABLE) ×2 IMPLANT
DRAPE OEC MINIVIEW 54X84 (DRAPES) ×2 IMPLANT
DRAPE U-SHAPE 47X51 STRL (DRAPES) ×2 IMPLANT
DRSG MEPITEL 4X7.2 (GAUZE/BANDAGES/DRESSINGS) ×2 IMPLANT
ELECT REM PT RETURN 9FT ADLT (ELECTROSURGICAL) ×2
ELECTRODE REM PT RTRN 9FT ADLT (ELECTROSURGICAL) ×1 IMPLANT
GAUZE SPONGE 4X4 12PLY STRL (GAUZE/BANDAGES/DRESSINGS) ×2 IMPLANT
GLOVE SRG 8 PF TXTR STRL LF DI (GLOVE) ×2 IMPLANT
GLOVE SURG ENC MOIS LTX SZ8 (GLOVE) ×2 IMPLANT
GLOVE SURG LTX SZ8 (GLOVE) ×2 IMPLANT
GLOVE SURG POLYISO LF SZ7 (GLOVE) ×1 IMPLANT
GLOVE SURG UNDER POLY LF SZ7 (GLOVE) ×2 IMPLANT
GLOVE SURG UNDER POLY LF SZ8 (GLOVE) ×4
GOWN STRL REUS W/ TWL LRG LVL3 (GOWN DISPOSABLE) ×1 IMPLANT
GOWN STRL REUS W/ TWL XL LVL3 (GOWN DISPOSABLE) ×2 IMPLANT
GOWN STRL REUS W/TWL LRG LVL3 (GOWN DISPOSABLE) ×2
GOWN STRL REUS W/TWL XL LVL3 (GOWN DISPOSABLE) ×4
K-WIRE .054X4 (WIRE) IMPLANT
K-WIRE COCR 0.9X95 (WIRE) ×2
K-WIRE COCR 1.1X105 (WIRE) ×4
KWIRE COCR 0.9X95 (WIRE) IMPLANT
KWIRE COCR 1.1X105 (WIRE) IMPLANT
NDL HYPO 25X1 1.5 SAFETY (NEEDLE) IMPLANT
NEEDLE HYPO 22GX1.5 SAFETY (NEEDLE) IMPLANT
NEEDLE HYPO 25X1 1.5 SAFETY (NEEDLE) IMPLANT
NS IRRIG 1000ML POUR BTL (IV SOLUTION) ×2 IMPLANT
PACK BASIN DAY SURGERY FS (CUSTOM PROCEDURE TRAY) ×2 IMPLANT
PAD CAST 4YDX4 CTTN HI CHSV (CAST SUPPLIES) ×1 IMPLANT
PADDING CAST ABS 4INX4YD NS (CAST SUPPLIES)
PADDING CAST ABS COTTON 4X4 ST (CAST SUPPLIES) IMPLANT
PADDING CAST COTTON 4X4 STRL (CAST SUPPLIES) ×2
PENCIL SMOKE EVACUATOR (MISCELLANEOUS) ×2 IMPLANT
SANITIZER HAND PURELL 535ML FO (MISCELLANEOUS) ×2 IMPLANT
SCREW HCS TWIST-OFF 2.0X13MM (Screw) ×2 IMPLANT
SCREW MAX VPC 2.5MMX10MM (Screw) ×2 IMPLANT
SCREW VPC 2.5X14MM (Screw) ×1 IMPLANT
SCREW VPC 2.5X16MM (Screw) ×1 IMPLANT
SCREW VPC 3.4X16 (Screw) ×1 IMPLANT
SHEET MEDIUM DRAPE 40X70 STRL (DRAPES) ×2 IMPLANT
SLEEVE SCD COMPRESS KNEE MED (STOCKING) ×2 IMPLANT
SPLINT FAST PLASTER 5X30 (CAST SUPPLIES)
SPLINT PLASTER CAST FAST 5X30 (CAST SUPPLIES) IMPLANT
SPONGE T-LAP 18X18 ~~LOC~~+RFID (SPONGE) ×2 IMPLANT
STOCKINETTE 6  STRL (DRAPES) ×1
STOCKINETTE 6 STRL (DRAPES) ×1 IMPLANT
SUCTION FRAZIER HANDLE 10FR (MISCELLANEOUS) ×1
SUCTION TUBE FRAZIER 10FR DISP (MISCELLANEOUS) ×1 IMPLANT
SUT ETHILON 3 0 PS 1 (SUTURE) ×3 IMPLANT
SUT MNCRL AB 3-0 PS2 18 (SUTURE) ×2 IMPLANT
SUT VIC AB 2-0 SH 27 (SUTURE) ×2
SUT VIC AB 2-0 SH 27XBRD (SUTURE) IMPLANT
SUT VICRYL 0 SH 27 (SUTURE) IMPLANT
SUT VICRYL 0 UR6 27IN ABS (SUTURE) IMPLANT
SYR BULB EAR ULCER 3OZ GRN STR (SYRINGE) ×2 IMPLANT
SYR CONTROL 10ML LL (SYRINGE) IMPLANT
TOWEL GREEN STERILE FF (TOWEL DISPOSABLE) ×4 IMPLANT
TUBE CONNECTING 20X1/4 (TUBING) ×2 IMPLANT
UNDERPAD 30X36 HEAVY ABSORB (UNDERPADS AND DIAPERS) ×2 IMPLANT

## 2021-05-07 NOTE — Anesthesia Procedure Notes (Signed)
Anesthesia Regional Block: Popliteal block   Pre-Anesthetic Checklist: , timeout performed,  Correct Patient, Correct Site, Correct Laterality,  Correct Procedure, Correct Position, site marked,  Risks and benefits discussed,  Pre-op evaluation,  At surgeon's request and post-op pain management  Laterality: Left  Prep: Maximum Sterile Barrier Precautions used, chloraprep       Needles:  Injection technique: Single-shot  Needle Type: Echogenic Stimulator Needle     Needle Length: 9cm  Needle Gauge: 21     Additional Needles:   Procedures:,,,, ultrasound used (permanent image in chart),,    Narrative:  Start time: 05/07/2021 7:33 AM End time: 05/07/2021 7:43 AM Injection made incrementally with aspirations every 5 mL. Anesthesiologist: Roderic Palau, MD

## 2021-05-07 NOTE — Anesthesia Postprocedure Evaluation (Signed)
Anesthesia Post Note  Patient: [name], newspaper  Procedure(s) Performed: Left foot removal of implants third metatarsal (Left: Foot) Third metatarsal condylectomy; Fourth and Fifth metatarsal osteotomies, hammertoe corrections; bunionette correction (Left: Foot)     Patient location during evaluation: PACU Anesthesia Type: General and Regional Level of consciousness: awake and alert Pain management: pain level controlled Vital Signs Assessment: post-procedure vital signs reviewed and stable Respiratory status: spontaneous breathing, nonlabored ventilation and respiratory function stable Cardiovascular status: blood pressure returned to baseline and stable Postop Assessment: no apparent nausea or vomiting Anesthetic complications: no   No notable events documented.  Last Vitals:  Vitals:   05/07/21 1015 05/07/21 1056  BP: 132/69 (!) 144/72  Pulse: 69 61  Resp: 14 18  Temp:  (!) 36.2 C  SpO2: 100% 99%    Last Pain:  Vitals:   05/07/21 1056  TempSrc:   PainSc: 0-No pain                 Jonovan Boedecker,W. EDMOND

## 2021-05-07 NOTE — Progress Notes (Signed)
Assisted Dr. Edmond Fitzgerald with left, ultrasound guided, popliteal block. Side rails up, monitors on throughout procedure. See vital signs in flow sheet. Tolerated Procedure well. °

## 2021-05-07 NOTE — Discharge Instructions (Addendum)
Gina Arthurs, MD [facility]  Please read the following information regarding your care after surgery.  Medications  You only need a prescription for the narcotic pain medicine (ex. oxycodone, Percocet, Norco).  All of the other medicines listed below are available over the counter. ? Aleve 2 pills twice a day for the first 3 days after surgery. ? acetominophen (Tylenol) 650 mg every 4-6 hours as you need for minor to moderate pain ? oxycodone as prescribed for severe pain  Narcotic pain medicine (ex. oxycodone, Percocet, Vicodin) will cause constipation.  To prevent this problem, take the following medicines while you are taking any pain medicine. ? docusate sodium (Colace) 100 mg twice a day ? senna (Senokot) 2 tablets twice a day  Weight Bearing ? Bear weight only on your operated foot in the post-op shoe.   Cast / Splint / Dressing ? Keep your splint, cast or dressing clean and dry.  Dont put anything (coat hanger, pencil, etc) down inside of it.  If it gets damp, use a hair dryer on the cool setting to dry it.  If it gets soaked, call the office to schedule an appointment for a cast change.   After your dressing, cast or splint is removed; you may shower, but do not soak or scrub the wound.  Allow the water to run over it, and then gently pat it dry.  Swelling It is normal for you to have swelling where you had surgery.  To reduce swelling and pain, keep your toes above your nose for at least 3 days after surgery.  It may be necessary to keep your foot or leg elevated for several weeks.  If it hurts, it should be elevated.  Follow Up Call my office at [phone] when you are discharged from the hospital or surgery center to schedule an appointment to be seen two weeks after surgery.  Call my office at [phone] if you develop a fever >101.5 F, nausea, vomiting, bleeding from the surgical site or severe pain.    No Tylenol until after 2pm if needed   Post Anesthesia Home  Care Instructions  Activity: Get plenty of rest for the remainder of the day. A responsible individual must stay with you for 24 hours following the procedure.  For the next 24 hours, DO NOT: -Drive a car -Advertising copywriter -Drink alcoholic beverages -Take any medication unless instructed by your physician -Make any legal decisions or sign important papers.  Meals: Start with liquid foods such as gelatin or soup. Progress to regular foods as tolerated. Avoid greasy, spicy, heavy foods. If nausea and/or vomiting occur, drink only clear liquids until the nausea and/or vomiting subsides. Call your physician if vomiting continues.  Special Instructions/Symptoms: Your throat may feel dry or sore from the anesthesia or the breathing tube placed in your throat during surgery. If this causes discomfort, gargle with warm salt water. The discomfort should disappear within 24 hours.  If you had a scopolamine patch placed behind your ear for the management of post- operative nausea and/or vomiting:  1. The medication in the patch is effective for 72 hours, after which it should be removed.  Wrap patch in a tissue and discard in the trash. Wash hands thoroughly with soap and water. 2. You may remove the patch earlier than 72 hours if you experience unpleasant side effects which may include dry mouth, dizziness or visual disturbances. 3. Avoid touching the patch. Wash your hands with soap and water after contact with the patch.  Regional Anesthesia Blocks  1. Numbness or the inability to move the "blocked" extremity may last from 3-48 hours after placement. The length of time depends on the medication injected and your individual response to the medication. If the numbness is not going away after 48 hours, call your surgeon.  2. The extremity that is blocked will need to be protected until the numbness is gone and the  Strength has returned. Because you cannot feel it, you will need to take extra  care to avoid injury. Because it may be weak, you may have difficulty moving it or using it. You may not know what position it is in without looking at it while the block is in effect.  3. For blocks in the legs and feet, returning to weight bearing and walking needs to be done carefully. You will need to wait until the numbness is entirely gone and the strength has returned. You should be able to move your leg and foot normally before you try and bear weight or walk. You will need someone to be with you when you first try to ensure you do not fall and possibly risk injury.  4. Bruising and tenderness at the needle site are common side effects and will resolve in a few days.  5. Persistent numbness or new problems with movement should be communicated to the surgeon or the [facility] [phone] [facility] [phone]).

## 2021-05-07 NOTE — Op Note (Signed)
05/07/2021  9:59 AM  PATIENT:  Gina Montgomery  56 y.o. female  PRE-OPERATIVE DIAGNOSIS: 1.  Left forefoot metatarsalgia 2.  Painful hardware left third metatarsal 3.  Recurrent left third, fourth and fifth hammertoe deformities 4.  Left forefoot bunionette deformity  POST-OPERATIVE DIAGNOSIS:  1.  Left forefoot metatarsalgia 2.  Painful hardware left third metatarsal 3.  Recurrent left second, fourth and fifth hammertoe deformities 4.  Left forefoot bunionette deformity  Procedure(s): 1.  Left third metatarsal removal of deep implants 2.  Left third metatarsal Weil osteotomy 3.  Left fourth metatarsal Weil osteotomy 4.  Left fifth metatarsal bunionette correction with oblique osteotomy 5.  Left fourth and fifth hammertoe corrections 6.  Percutaneous lengthening of the left second toe extensor tendons 7.  Left foot AP, lateral and oblique radiographs  SURGEON:  Wylene Simmer, MD  ASSISTANT: Mechele Claude, PA-C  ANESTHESIA:   General, regional  EBL:  minimal   TOURNIQUET:   Total Tourniquet Time Documented: Thigh (Left) - 64 minutes Total: Thigh (Left) - 64 minutes  COMPLICATIONS:  None apparent  DISPOSITION:  Extubated, awake and stable to recovery.  INDICATION FOR PROCEDURE: The patient is a 56 year old female with complicated recurrences of left forefoot deformities after previous surgical treatment in the remote past.  She has signs and symptoms of metatarsalgia and recurrent hammertoes as well as a painful bunionette and painful hardware.  She has failed nonoperative treatment to date and presents today for surgical correction of these left forefoot deformities.  The risks and benefits of the alternative treatment options have been discussed in detail.  The patient wishes to proceed with surgery and specifically understands risks of bleeding, infection, nerve damage, blood clots, need for additional surgery, amputation and death.    PROCEDURE IN DETAIL:  After pre  operative consent was obtained, and the correct operative site was identified, the patient was brought to the operating room and placed supine on the OR table.  Anesthesia was administered.  Pre-operative antibiotics were administered.  A surgical timeout was taken.  The left lower extremity was prepped and draped in standard sterile fashion with a tourniquet around the thigh.  The extremity was elevated and the tourniquet was inflated to 250 mmHg.  A longitudinal incision was made at the webspace between the third and fourth metatarsals.  Dissection was carried sharply down through the subcutaneous tissues and over to the third MTP joint.  The screw head was identified at the third metatarsal dorsally.  The screw was cleaned of all soft tissue and removed without difficulty.  The extensor tendons were protected and the metatarsal head exposed.  A Weil osteotomy was made with the oscillating saw taking the wedge of bone out and allowing the metatarsal head to retract appropriately.  The osteotomy was fixed with a 2 mm Zimmer [employer] FRS screw.  Overhanging bone was trimmed with a rondure.  Dissection was then directed to the fourth MTP joint.  The extensor tendons were protected and a Weil osteotomy made with the oscillating saw.  The head of the metatarsal was allowed to retract proximally and the osteotomy was fixed with a 2 mm FRS screw.  Overhanging bone was trimmed with a rondure.  The third hammertoe deformity was adequately corrected at this point.  The fourth hammertoe deformity was noted to be fixed.  The previous dorsal incision was opened again sharply and dissection carried down through the subcutaneous tissues.  The PIP joint was identified and was noted to be malunited.  A wedge osteotomy was made at the level of the PIP joint in order to correct the angular deformity.  The osteotomy was then fixed with a 3.4 mm Zimmer [employer] VPC screw.  Attention was turned to the lateral forefoot.  A  longitudinal incision was made dorsal laterally.  Dissection was carried sharply down through the subcutaneous tissues.  The lateral joint capsule was incised and elevated.  An oblique osteotomy was made in the distal metatarsal shaft.  The distal fragment was rotated and the osteotomy fixed with two 2.5 mm VPC screws.  The prominent bunionette was then resected along with overhanging bone using the oscillating saw.  Radiographs confirmed appropriate correction of the 4-5 intermetatarsal angle and appropriate positioning of the hardware.  The fifth toe hammertoe deformity was also noted to be fixed.  A transverse incision was made over the healed PIP joint at the site of the previous incision.  Dissection was carried sharply down through the subcutaneous tissues.  The PIP joint was noted to have fibrous healing.  Both sides of the joint were recut and the joint we reduced.  The osteotomy was fixed with a 2.5 mm VPC screw.  AP and lateral radiographs confirmed appropriate position and length of all hardware and appropriate reduction of the third through fifth ray deformities.  On gross examination the second hammertoe deformity was noted to be recurrent with dorsiflexion of the toe.  The decision was made to proceed with a percutaneous extensor tendon lengthening.  A Beaver blade was inserted and used to lengthen the extensor tendons.  The toe was then noted to rest appropriately with simulated weightbearing examination.  Wounds were irrigated copiously.  The lateral joint capsule was repaired with imbricating sutures of 2-0 Vicryl.  Subcutaneous tissues were approximated with Monocryl.  Skin incisions were sprinkled with vancomycin prior to closure.  The skin incisions were then closed with nylon.  Sterile dressings were applied followed by a compression wrap.  The tourniquet was released after application of the dressings.  The patient was awakened from anesthesia and transported to the recovery room in stable  condition.   FOLLOW UP PLAN: Weightbearing as tolerated in a Darco shoe.  Follow-up in the office in 2 weeks for suture removal.   RADIOGRAPHS: AP, lateral and oblique radiographs of the left foot are obtained intraoperatively.  These show interval correction of second, fourth and fifth hammertoe deformities as well as shortening of the third and fourth metatarsals and rotational osteotomy of the fifth metatarsal.  Hardware is appropriately positioned and of the appropriate lengths.  No other acute injuries are noted.    Mechele Claude PA-C was present and scrubbed for the duration of the operative case. His assistance was essential in positioning the patient, prepping and draping, gaining and maintaining exposure, performing the operation, closing and dressing the wounds and applying the splint.

## 2021-05-07 NOTE — Transfer of Care (Signed)
Immediate Anesthesia Transfer of Care Note  Patient: Riko Hallas  Procedure(s) Performed: Left foot removal of implants third metatarsal (Left: Foot) Third metatarsal condylectomy; Fourth and Fifth metatarsal osteotomies, hammertoe corrections; bunionette correction (Left: Foot)  Patient Location: PACU  Anesthesia Type:GA combined with regional for post-op pain  Level of Consciousness: drowsy and patient cooperative  Airway & Oxygen Therapy: Patient Spontanous Breathing and Patient connected to face mask oxygen  Post-op Assessment: Report given to RN and Post -op Vital signs reviewed and stable  Post vital signs: Reviewed and stable  Last Vitals:  Vitals Value Taken Time  BP    Temp    Pulse    Resp    SpO2      Last Pain:  Vitals:   05/07/21 0653  TempSrc: Oral  PainSc: 0-No pain      Patients Stated Pain Goal: 8 (05/07/21 0653)  Complications: No notable events documented.

## 2021-05-07 NOTE — Anesthesia Procedure Notes (Signed)
Procedure Name: LMA Insertion Date/Time: 05/07/2021 8:35 AM Performed by: Sheryn Bison, CRNA Pre-anesthesia Checklist: Patient identified, Emergency Drugs available, Suction available and Patient being monitored Patient Re-evaluated:Patient Re-evaluated prior to induction Oxygen Delivery Method: Circle System Utilized Preoxygenation: Pre-oxygenation with 100% oxygen Induction Type: IV induction Ventilation: Mask ventilation without difficulty LMA: LMA inserted LMA Size: 4.0 Number of attempts: 1 Airway Equipment and Method: bite block Placement Confirmation: positive ETCO2 Tube secured with: Tape Dental Injury: Teeth and Oropharynx as per pre-operative assessment

## 2021-05-07 NOTE — Anesthesia Preprocedure Evaluation (Addendum)
Anesthesia Evaluation  Patient identified by MRN, date of birth, ID band Patient awake    Reviewed: Allergy & Precautions, H&P , NPO status , Patient's Chart, lab work & pertinent test results  Airway Mallampati: II  TM Distance: >3 FB Neck ROM: Full    Dental no notable dental hx. (+) Edentulous Upper, Partial Lower, Dental Advisory Given   Pulmonary neg pulmonary ROS, Patient abstained from smoking., former smoker,    Pulmonary exam normal breath sounds clear to auscultation       Cardiovascular negative cardio ROS   Rhythm:Regular Rate:Normal     Neuro/Psych Anxiety Depression negative neurological ROS  negative psych ROS   GI/Hepatic negative GI ROS, Neg liver ROS,   Endo/Other  negative endocrine ROS  Renal/GU negative Renal ROS  negative genitourinary   Musculoskeletal   Abdominal   Peds  Hematology negative hematology ROS (+)   Anesthesia Other Findings   Reproductive/Obstetrics negative OB ROS                            Anesthesia Physical Anesthesia Plan  ASA: 2  Anesthesia Plan: General   Post-op Pain Management: Regional block and Tylenol PO (pre-op)   Induction: Intravenous  PONV Risk Score and Plan: 4 or greater and Ondansetron, Dexamethasone and Midazolam  Airway Management Planned: LMA  Additional Equipment:   Intra-op Plan:   Post-operative Plan: Extubation in OR  Informed Consent: I have reviewed the patients History and Physical, chart, labs and discussed the procedure including the risks, benefits and alternatives for the proposed anesthesia with the patient or authorized representative who has indicated his/her understanding and acceptance.     Dental advisory given  Plan Discussed with: CRNA  Anesthesia Plan Comments:         Anesthesia Quick Evaluation

## 2021-05-07 NOTE — H&P (Signed)
Gina Montgomery is an 56 y.o. female.   Chief Complaint: Left foot pain HPI: 56 year old female with a history of previous left forefoot surgery complains of continued pain at the lesser toes.  She has a plantar keratosis beneath a prominent third metatarsal.  She has painful hardware at the third metatarsal as well.  She has fourth and fifth hammertoes and a bunionette.  She has failed nonoperative treatment to date including activity modification, oral anti-inflammatories and shoewear modification.  She presents today for revision left forefoot surgery.  Past Medical History:  Diagnosis Date   Anxiety    Depression    High cholesterol    Psoriasis    back of neck    Past Surgical History:  Procedure Laterality Date   ABDOMINAL HYSTERECTOMY     FOOT SURGERY Bilateral    75 bolts each foot   HAMMERTOE RECONSTRUCTION WITH WEIL OSTEOTOMY Right 02/05/2021   Procedure: 3-4 Weil osteotomy and revision hammertoe correction; 2-3 MTP joint dorsal capsulotomy and extensor tendon lengthening; correction of tailer's bunion;  Surgeon: Toni Arthurs, MD;  Location: [facility];  Service: Orthopedics;  Laterality: Right;   HARDWARE REMOVAL Right 02/05/2021   Procedure: Removal of deep implants 2-3 metatarsal;  Surgeon: Toni Arthurs, MD;  Location: [facility];  Service: Orthopedics;  Laterality: Right;    Family History  Problem Relation Age of Onset   Cancer Mother        leukemia   Cancer Maternal Aunt        breast cancer   Cancer Paternal Aunt        breast cancer   Heart disease Maternal Grandmother    Heart disease Maternal Grandfather    Alzheimer's disease Paternal Grandmother    Diabetes Neg Hx    Stroke Neg Hx    Social History:  reports that she has quit smoking. Her smoking use included cigarettes. She has a 9.75 pack-year smoking history. She has never used smokeless tobacco. She reports that she does not drink alcohol and does not use  drugs.  Allergies: No Known Allergies  Medications Prior to Admission  Medication Sig Dispense Refill   cholecalciferol (VITAMIN D3) 25 MCG (1000 UNIT) tablet Take 1,000 Units by mouth daily.     hydrOXYzine (ATARAX/VISTARIL) 25 MG tablet Take 25 mg by mouth 3 (three) times daily.     venlafaxine (EFFEXOR) 75 MG tablet Take 75 mg by mouth every morning.     docusate sodium (COLACE) 100 MG capsule Take 1 capsule (100 mg total) by mouth 2 (two) times daily. While taking narcotic pain medicine. 30 capsule 0   senna (SENOKOT) 8.6 MG TABS tablet Take 2 tablets (17.2 mg total) by mouth 2 (two) times daily. 30 tablet 0   tiZANidine (ZANAFLEX) 4 MG tablet Take 4 mg by mouth every 6 (six) hours as needed for muscle spasms.      No results found for this or any previous visit (from the past 48 hour(s)). DG MINI C-ARM IMAGE ONLY  Result Date: 05/07/2021 There is no interpretation for this exam.  This order is for images obtained during a surgical procedure.  Please See "Surgeries" Tab for more information regarding the procedure.    Review of Systems no recent fever, chills, nausea, vomiting or changes in her appetite  Blood pressure (!) 110/54, pulse 64, temperature 97.9 F (36.6 C), temperature source Oral, resp. rate 14, height 5\' 8"  (1.727 m), weight 91.9 kg, SpO2 97 %. Physical Exam  Well-nourished well-developed woman in no apparent distress.  Alert and oriented x4.  Normal mood and affect.  Extraocular motions are intact.  Respirations are unlabored.  Gait is normal.  The left foot has a plantar keratosis beneath the third metatarsal.  Recurrent third fourth and fifth hammertoe deformities.  Small bunionette deformity is also noted.  Skin is healthy and intact.  Pulses are palpable.  Normal sensibility to light touch dorsally and plantarly at the forefoot.  No lymphadenopathy. Assessment/Plan Left forefoot metatarsalgia, painful hardware, recurrent third through fifth hammertoe deformities and  bunionette -to the operating room today for revision left forefoot reconstruction.  The risks and benefits of the alternative treatment options have been discussed in detail.  The patient wishes to proceed with surgery and specifically understands risks of bleeding, infection, nerve damage, blood clots, need for additional surgery, amputation and death.   Toni Arthurs, MD 05/27/21, 8:14 AM

## 2021-05-08 ENCOUNTER — Encounter (HOSPITAL_BASED_OUTPATIENT_CLINIC_OR_DEPARTMENT_OTHER): Payer: Self-pay | Admitting: Orthopedic Surgery

## 2021-05-13 DIAGNOSIS — F17200 Nicotine dependence, unspecified, uncomplicated: Secondary | ICD-10-CM | POA: Diagnosis not present

## 2021-05-13 DIAGNOSIS — F411 Generalized anxiety disorder: Secondary | ICD-10-CM | POA: Diagnosis not present

## 2021-05-13 DIAGNOSIS — F331 Major depressive disorder, recurrent, moderate: Secondary | ICD-10-CM | POA: Diagnosis not present

## 2021-05-21 ENCOUNTER — Encounter: Payer: Self-pay | Admitting: Nurse Practitioner

## 2021-06-17 DIAGNOSIS — M79672 Pain in left foot: Secondary | ICD-10-CM | POA: Diagnosis not present

## 2021-07-17 DIAGNOSIS — M79672 Pain in left foot: Secondary | ICD-10-CM | POA: Diagnosis not present

## 2021-07-27 DIAGNOSIS — F411 Generalized anxiety disorder: Secondary | ICD-10-CM | POA: Diagnosis not present

## 2021-07-27 DIAGNOSIS — F17200 Nicotine dependence, unspecified, uncomplicated: Secondary | ICD-10-CM | POA: Diagnosis not present

## 2021-07-27 DIAGNOSIS — F331 Major depressive disorder, recurrent, moderate: Secondary | ICD-10-CM | POA: Diagnosis not present

## 2021-08-17 DIAGNOSIS — M79672 Pain in left foot: Secondary | ICD-10-CM | POA: Diagnosis not present

## 2021-09-16 DIAGNOSIS — M2041 Other hammer toe(s) (acquired), right foot: Secondary | ICD-10-CM | POA: Diagnosis not present

## 2021-09-16 DIAGNOSIS — M79672 Pain in left foot: Secondary | ICD-10-CM | POA: Diagnosis not present

## 2021-10-21 ENCOUNTER — Other Ambulatory Visit: Payer: Self-pay | Admitting: Student

## 2021-10-21 DIAGNOSIS — M79672 Pain in left foot: Secondary | ICD-10-CM | POA: Diagnosis not present

## 2021-10-21 DIAGNOSIS — M2041 Other hammer toe(s) (acquired), right foot: Secondary | ICD-10-CM | POA: Diagnosis not present

## 2021-11-20 ENCOUNTER — Ambulatory Visit
Admission: RE | Admit: 2021-11-20 | Discharge: 2021-11-20 | Disposition: A | Discharge status: Home / Self Care | Payer: BC Managed Care – PPO | Source: Ambulatory Visit | Attending: Student | Admitting: Student

## 2021-11-20 DIAGNOSIS — M79672 Pain in left foot: Secondary | ICD-10-CM | POA: Diagnosis not present

## 2021-11-25 DIAGNOSIS — M79672 Pain in left foot: Secondary | ICD-10-CM | POA: Diagnosis not present

## 2021-12-17 DIAGNOSIS — F331 Major depressive disorder, recurrent, moderate: Secondary | ICD-10-CM | POA: Diagnosis not present

## 2021-12-17 DIAGNOSIS — F411 Generalized anxiety disorder: Secondary | ICD-10-CM | POA: Diagnosis not present

## 2021-12-17 DIAGNOSIS — F17200 Nicotine dependence, unspecified, uncomplicated: Secondary | ICD-10-CM | POA: Diagnosis not present

## 2022-03-05 DIAGNOSIS — M79672 Pain in left foot: Secondary | ICD-10-CM | POA: Diagnosis not present

## 2022-03-09 DIAGNOSIS — F411 Generalized anxiety disorder: Secondary | ICD-10-CM | POA: Diagnosis not present

## 2022-03-09 DIAGNOSIS — F331 Major depressive disorder, recurrent, moderate: Secondary | ICD-10-CM | POA: Diagnosis not present

## 2022-03-09 DIAGNOSIS — F17200 Nicotine dependence, unspecified, uncomplicated: Secondary | ICD-10-CM | POA: Diagnosis not present

## 2022-04-01 IMAGING — US US SOFT TISSUE HEAD/NECK
1 series · 13 of 13 positions shown · non-contrast
Comparison: None.

CLINICAL DATA: Palpable abnormality involving the posterior aspect
of the left side of the neck for the past several years.

EXAM:
ULTRASOUND OF HEAD/NECK SOFT TISSUES
TECHNIQUE: Ultrasound examination of the head and neck soft tissues was
performed in the area of clinical concern.

[Series 1: us soft tissue head/neck · 0.06mm/px · 13 acquisitions, 13 frames shown]
[im 1/13]
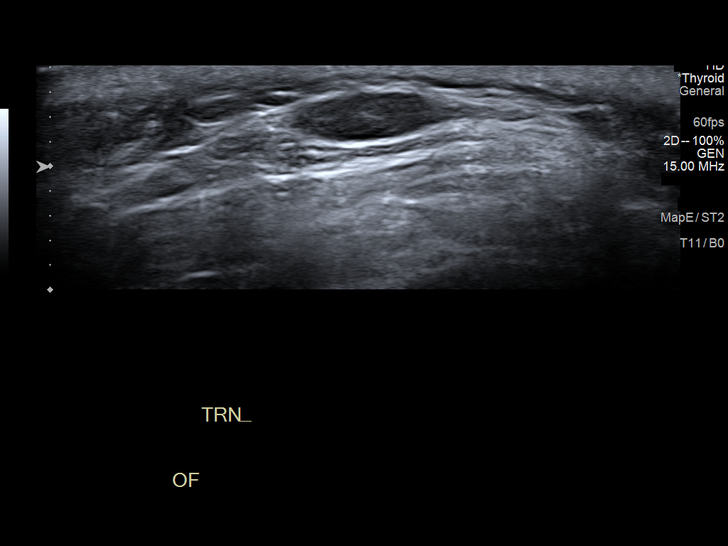
[im 2/13]
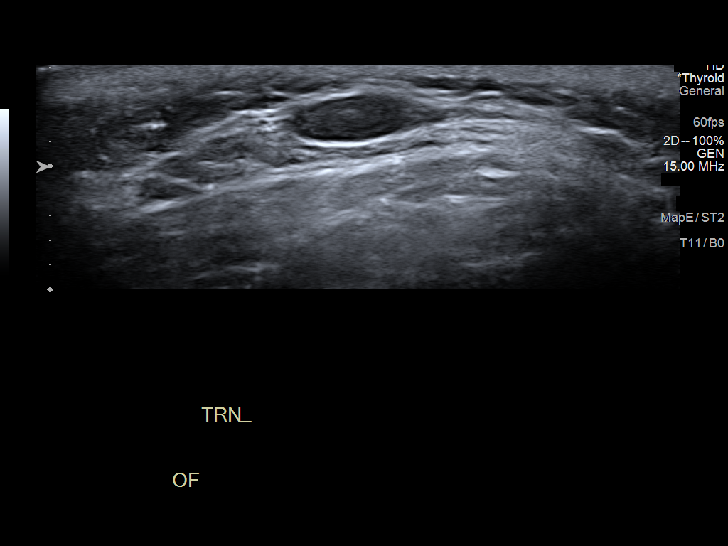
[im 3/13]
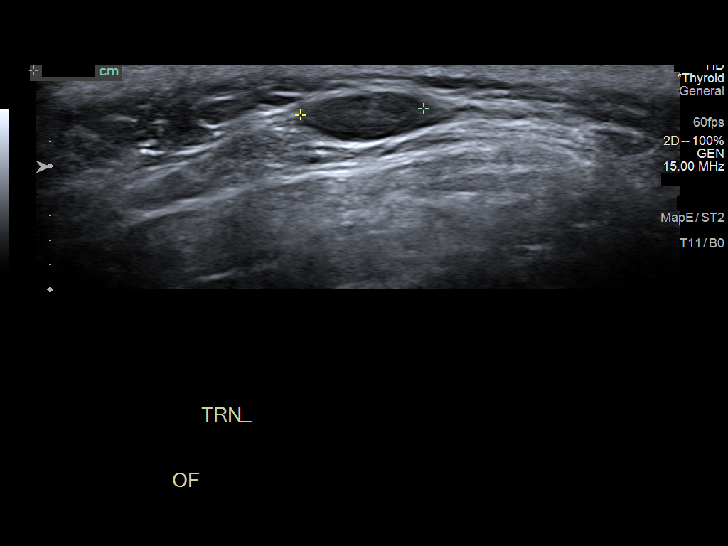
[im 4/13]
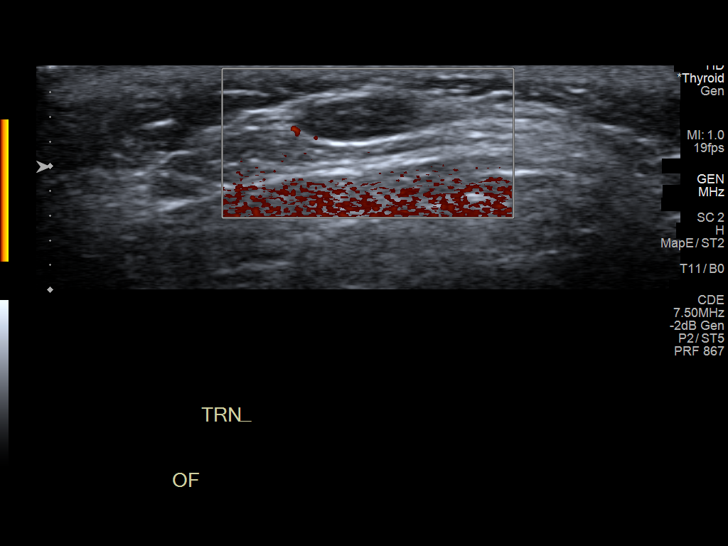
[im 5/13]
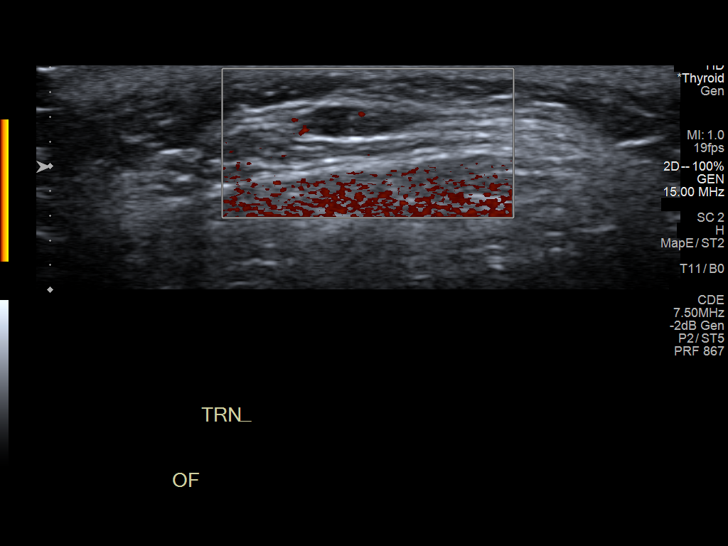
[im 6/13]
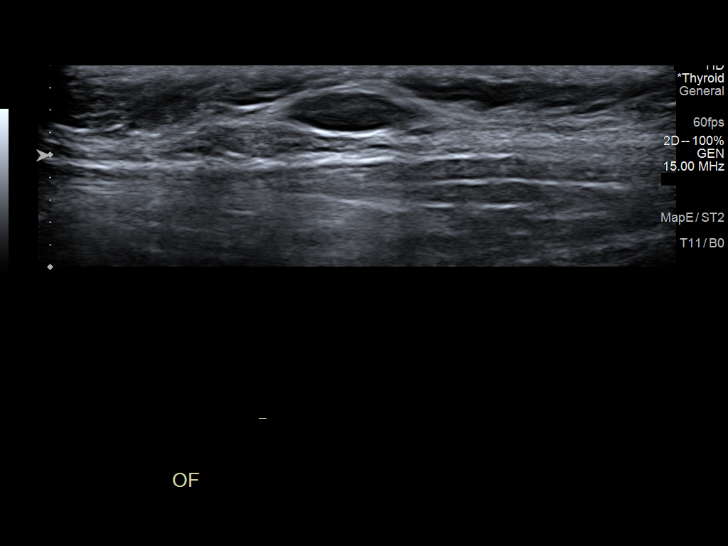
[im 7/13]
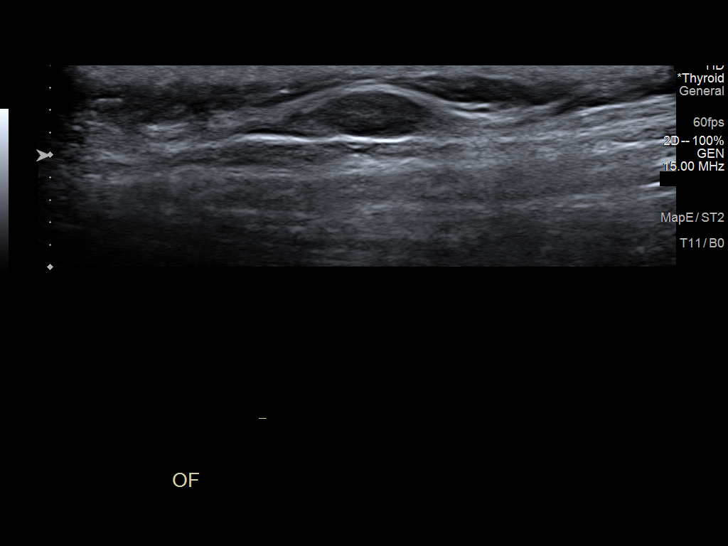
[im 8/13]
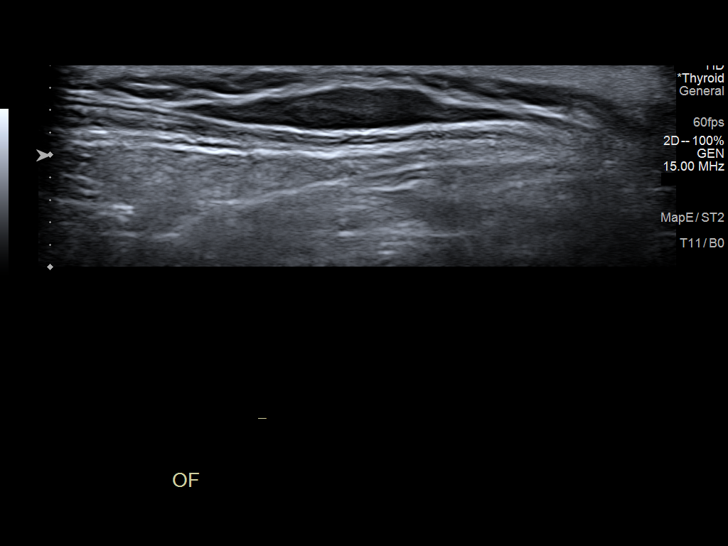
[im 9/13]
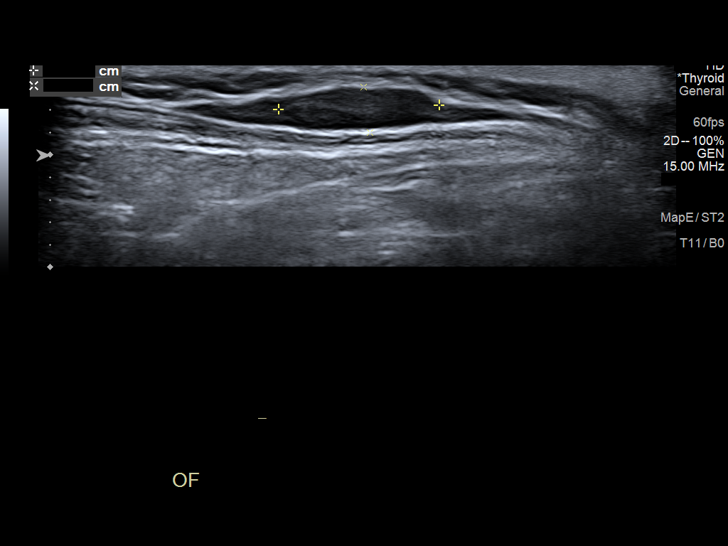
[im 10/13]
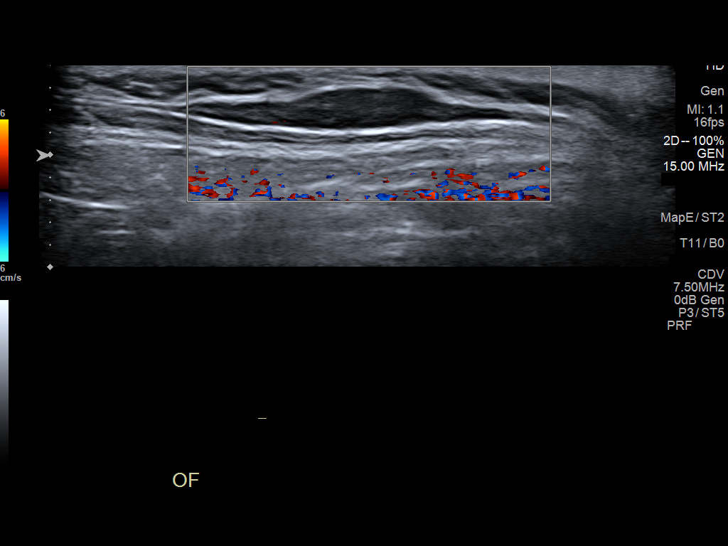
[im 11/13]
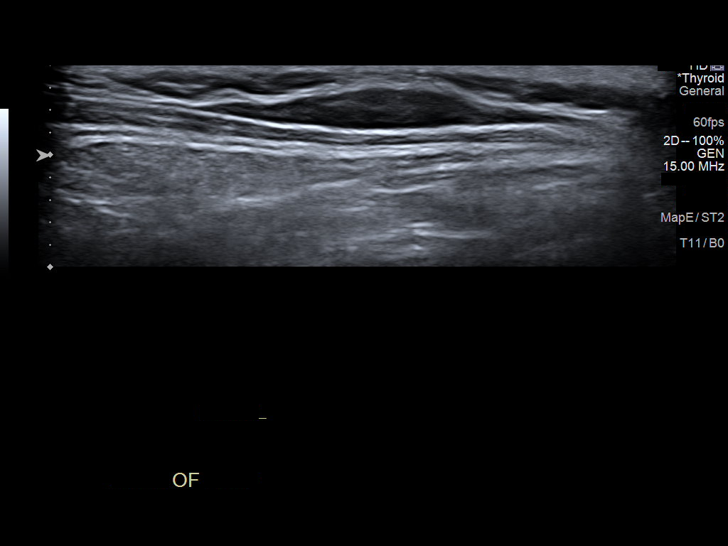
[im 12/13]
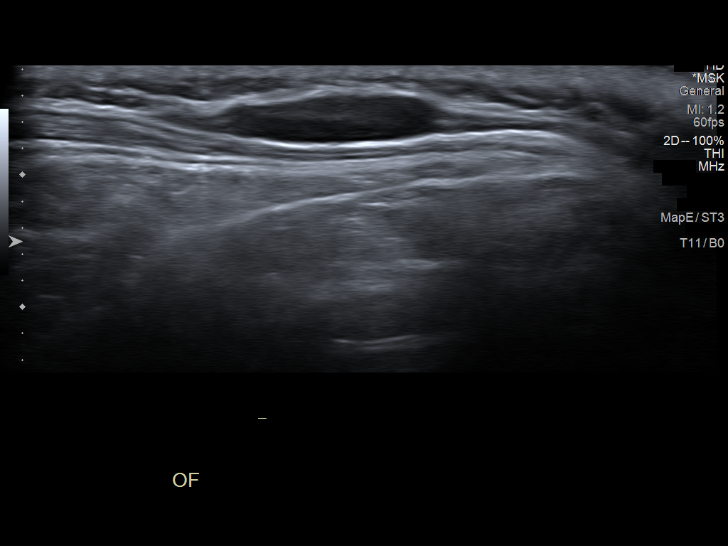
[im 13/13]
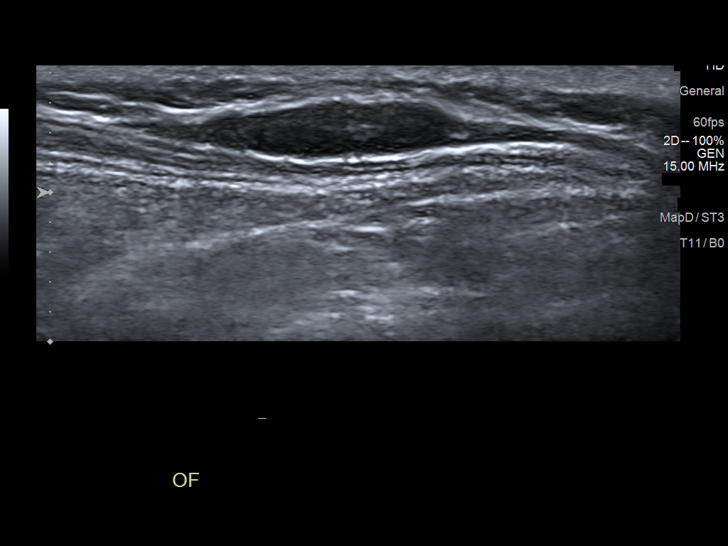

[13 of 13 positions shown; findings below may reference images not displayed]

FINDINGS: Sonographic evaluation of the patient's palpable area of concern
correlates with a benign appearing non pathologically enlarged
subcutaneous lymph node. The lymph node is not enlarged by size
criteria measuring 0.4 cm in greatest short axis diameter and
maintains a benign fatty hilum. This lymph node does not demonstrate
communication with the overlying dermal surface.

Otherwise, there is no sonographic correlate for patient's palpable
area of concern.
IMPRESSION: Sonographic evaluation of the patient's palpable area of concern
involving the posterior aspect the left side of the neck correlates
with a benign appearing non pathologically enlarged subcutaneous
lymph node, nonspecific though presumably reactive in etiology.

## 2022-05-10 ENCOUNTER — Other Ambulatory Visit: Payer: Self-pay

## 2022-05-10 ENCOUNTER — Ambulatory Visit (HOSPITAL_COMMUNITY)
Admission: EM | Admit: 2022-05-10 | Discharge: 2022-05-10 | Disposition: A | Discharge status: Home / Self Care | Payer: BC Managed Care – PPO | Attending: Family Medicine | Admitting: Family Medicine

## 2022-05-10 ENCOUNTER — Ambulatory Visit (INDEPENDENT_AMBULATORY_CARE_PROVIDER_SITE_OTHER): Payer: BC Managed Care – PPO

## 2022-05-10 ENCOUNTER — Encounter (HOSPITAL_COMMUNITY): Payer: Self-pay | Admitting: *Deleted

## 2022-05-10 DIAGNOSIS — R059 Cough, unspecified: Secondary | ICD-10-CM

## 2022-05-10 DIAGNOSIS — R0602 Shortness of breath: Secondary | ICD-10-CM | POA: Insufficient documentation

## 2022-05-10 DIAGNOSIS — J069 Acute upper respiratory infection, unspecified: Secondary | ICD-10-CM

## 2022-05-10 DIAGNOSIS — U071 COVID-19: Secondary | ICD-10-CM | POA: Diagnosis not present

## 2022-05-10 LAB — SARS CORONAVIRUS 2 (TAT 6-24 HRS): SARS Coronavirus 2: POSITIVE — AB

## 2022-05-10 MED ORDER — BENZONATATE 100 MG PO CAPS: 100.0000 mg | ORAL_CAPSULE | Freq: Three times a day (TID) | ORAL | 0 refills | Status: DC | PRN

## 2022-05-10 NOTE — Discharge Instructions (Signed)
Your chest x-ray was clear and did not show any pneumonia or fluid  Take benzonatate 100 mg, 1 tab every 8 hours as needed for cough.   You have been swabbed for COVID, and the test will result in the next 24 hours. Our staff will call you if positive. If the COVID test is positive, you should quarantine for 5 days from the start of your symptoms.  On days 6-10 from the start of your illness, you should wear a mask if out in public.

## 2022-05-10 NOTE — ED Triage Notes (Signed)
Pt reports she has a cough,HA, ear pain,general body aches. Sx's started on SAt.

## 2022-05-10 NOTE — ED Provider Notes (Signed)
[facility]    CSN: [id number] Arrival date & time: 05/10/22  0850      History   Chief Complaint Chief Complaint  Patient presents with   Headache   Otalgia   Sore Throat   Cough   Generalized Body Aches    HPI Gina Montgomery is a 57 y.o. female.    Headache Associated symptoms: cough and ear pain   Otalgia Associated symptoms: cough and headaches   Sore Throat Associated symptoms include headaches.  Cough Associated symptoms: ear pain and headaches    Here for shortness of breath and cough and rhinorrhea.  Symptoms began on January 20.  She has not had any fever at home and her temperature is normal here.  She has had a lot of myalgia and malaise.  No nausea or vomiting or diarrhea No history of asthma; she has smoked in the past   Past Medical History:  Diagnosis Date   Anxiety    Depression    High cholesterol    Psoriasis    back of neck    Patient Active Problem List   Diagnosis Date Noted   Preventative health care 01/22/2021   Other fatigue 01/22/2021   Post-menopausal 01/22/2021   Establishing care with new doctor, encounter for 12/16/2020   Lump in neck 12/16/2020   Bilateral foot pain 12/16/2020   Muscle cramp 12/16/2020   Screening for tuberculosis 12/16/2020   Paresthesia 03/25/2016   Anxiety and depression 02/16/2016    Past Surgical History:  Procedure Laterality Date   ABDOMINAL HYSTERECTOMY     BUNIONECTOMY WITH HAMMERTOE RECONSTRUCTION Left 05/07/2021   Procedure: Third metatarsal condylectomy; Fourth and Fifth metatarsal osteotomies, hammertoe corrections; bunionette correction;  Surgeon: Wylene Simmer, MD;  Location: [facility];  Service: Orthopedics;  Laterality: Left;   FOOT SURGERY Bilateral    75 bolts each foot   HAMMERTOE RECONSTRUCTION WITH WEIL OSTEOTOMY Right 02/05/2021   Procedure: 3-4 Weil osteotomy and revision hammertoe correction; 2-3 MTP joint dorsal capsulotomy and extensor tendon  lengthening; correction of tailer's bunion;  Surgeon: Wylene Simmer, MD;  Location: [facility];  Service: Orthopedics;  Laterality: Right;   HARDWARE REMOVAL Right 02/05/2021   Procedure: Removal of deep implants 2-3 metatarsal;  Surgeon: Wylene Simmer, MD;  Location: [facility];  Service: Orthopedics;  Laterality: Right;   HARDWARE REMOVAL Left 05/07/2021   Procedure: Left foot removal of implants third metatarsal;  Surgeon: Wylene Simmer, MD;  Location: [facility];  Service: Orthopedics;  Laterality: Left;    OB History   No obstetric history on file.      Home Medications    Prior to Admission medications   Medication Sig Start Date End Date Taking? Authorizing Provider  benzonatate (TESSALON) 100 MG capsule Take 1 capsule (100 mg total) by mouth 3 (three) times daily as needed for cough. 05/10/22  Yes Barrett Henle, MD  cholecalciferol (VITAMIN D3) 25 MCG (1000 UNIT) tablet Take 1,000 Units by mouth daily.    [provider]  docusate sodium (COLACE) 100 MG capsule Take 1 capsule (100 mg total) by mouth 2 (two) times daily. While taking narcotic pain medicine. 02/05/21   Corky Sing, PA-C  hydrOXYzine (ATARAX/VISTARIL) 25 MG tablet Take 25 mg by mouth 3 (three) times daily. 12/11/20   [provider]  senna (SENOKOT) 8.6 MG TABS tablet Take 2 tablets (17.2 mg total) by mouth 2 (two) times daily. 02/05/21   Mechele Claude  Pike, PA-C  tiZANidine (ZANAFLEX) 4 MG tablet Take 4 mg by mouth every 6 (six) hours as needed for muscle spasms.    [provider]  venlafaxine (EFFEXOR) 75 MG tablet Take 75 mg by mouth every morning.    [provider]    Family History Family History  Problem Relation Age of Onset   Cancer Mother        leukemia   Cancer Maternal Aunt        breast cancer   Cancer Paternal Aunt        breast cancer   Heart disease Maternal Grandmother    Heart disease Maternal  Grandfather    Alzheimer's disease Paternal Grandmother    Diabetes Neg Hx    Stroke Neg Hx     Social History Social History   Tobacco Use   Smoking status: Former    Packs/day: 0.25    Years: 39.00    Total pack years: 9.75    Types: Cigarettes   Smokeless tobacco: Never   Tobacco comments:    Quit and stared back.  Vaping Use   Vaping Use: Every day   Substances: Nicotine  Substance Use Topics   Alcohol use: No   Drug use: No     Allergies   Patient has no known allergies.   Review of Systems Review of Systems  HENT:  Positive for ear pain.   Respiratory:  Positive for cough.   Neurological:  Positive for headaches.     Physical Exam Triage Vital Signs ED Triage Vitals  Enc Vitals Group     BP 05/10/22 0904 111/76     Pulse Rate 05/10/22 0904 100     Resp 05/10/22 0904 18     Temp 05/10/22 0904 98.3 F (36.8 C)     Temp src --      SpO2 05/10/22 0904 97 %     Weight --      Height --      Head Circumference --      Peak Flow --      Pain Score 05/10/22 0902 8     Pain Loc --      Pain Edu? --      Excl. in [place]? --    No data found.  Updated Vital Signs BP 111/76   Pulse 100   Temp 98.3 F (36.8 C)   Resp 18   SpO2 97%   Visual Acuity Right Eye Distance:   Left Eye Distance:   Bilateral Distance:    Right Eye Near:   Left Eye Near:    Bilateral Near:     Physical Exam Vitals reviewed.  Constitutional:      General: She is not in acute distress.    Appearance: She is not ill-appearing, toxic-appearing or diaphoretic.  HENT:     Right Ear: Tympanic membrane and ear canal normal.     Left Ear: Tympanic membrane and ear canal normal.     Nose: Congestion present.     Mouth/Throat:     Mouth: Mucous membranes are moist.     Pharynx: No oropharyngeal exudate or posterior oropharyngeal erythema.  Eyes:     Extraocular Movements: Extraocular movements intact.     Conjunctiva/sclera: Conjunctivae normal.     Pupils: Pupils are  equal, round, and reactive to light.  Cardiovascular:     Rate and Rhythm: Normal rate and regular rhythm.     Heart sounds: No murmur heard. Pulmonary:  Effort: Pulmonary effort is normal. No respiratory distress.     Breath sounds: No stridor. No wheezing, rhonchi or rales.  Musculoskeletal:     Cervical back: Neck supple.  Lymphadenopathy:     Cervical: No cervical adenopathy.  Skin:    Capillary Refill: Capillary refill takes less than 2 seconds.     Coloration: Skin is not jaundiced or pale.  Neurological:     General: No focal deficit present.     Mental Status: She is alert and oriented to person, place, and time.  Psychiatric:        Behavior: Behavior normal.      UC Treatments / Results  Labs (all labs ordered are listed, but only abnormal results are displayed) Labs Reviewed  SARS CORONAVIRUS 2 (TAT 6-24 HRS)    EKG   Radiology DG Chest 2 View  Result Date: 05/10/2022 CLINICAL DATA:  Shortness of breath cough EXAM: CHEST - 2 VIEW COMPARISON:  None Available. FINDINGS: No pleural effusion. No pneumothorax. No focal airspace opacity. Normal cardiac and mediastinal contours. No displaced rib fractures. Visualized upper abdomen is unremarkable. Vertebral body heights are maintained. Degenerative changes of the right AC joint. IMPRESSION: No active cardiopulmonary disease. Electronically Signed   By: Lorenza Cambridge M.D.   On: 05/10/2022 09:40    Procedures Procedures (including critical care time)  Medications Ordered in UC Medications - No data to display  Initial Impression / Assessment and Plan / UC Course  I have reviewed the triage vital signs and the nursing notes.  Pertinent labs & imaging results that were available during my care of the patient were reviewed by me and considered in my medical decision making (see chart for details).        CXR is clear. COVID swab is done and she is a candidate for Paxlovid if positive; last eGFR was 87 in  2022 Medication sent for cough Final Clinical Impressions(s) / UC Diagnoses   Final diagnoses:  Viral URI with cough     Discharge Instructions      Your chest x-ray was clear and did not show any pneumonia or fluid  Take benzonatate 100 mg, 1 tab every 8 hours as needed for cough.   You have been swabbed for COVID, and the test will result in the next 24 hours. Our staff will call you if positive. If the COVID test is positive, you should quarantine for 5 days from the start of your symptoms.  On days 6-10 from the start of your illness, you should wear a mask if out in public.      ED Prescriptions     Medication Sig Dispense Auth. Provider   benzonatate (TESSALON) 100 MG capsule Take 1 capsule (100 mg total) by mouth 3 (three) times daily as needed for cough. 21 capsule Zenia Resides, MD      PDMP not reviewed this encounter.   Zenia Resides, MD 05/10/22 [phone]

## 2022-05-11 ENCOUNTER — Telehealth (HOSPITAL_COMMUNITY): Payer: Self-pay | Admitting: Emergency Medicine

## 2022-05-11 MED ORDER — NIRMATRELVIR/RITONAVIR (PAXLOVID)TABLET: 3.0000 | ORAL_TABLET | Freq: Two times a day (BID) | ORAL | 0 refills | Status: AC

## 2022-05-18 DIAGNOSIS — F411 Generalized anxiety disorder: Secondary | ICD-10-CM | POA: Diagnosis not present

## 2022-05-18 DIAGNOSIS — F331 Major depressive disorder, recurrent, moderate: Secondary | ICD-10-CM | POA: Diagnosis not present

## 2022-06-02 IMAGING — CT CT NECK W/ CM
5 series · 16 of 33 positions shown, 18 images · IV contrast (omnipaque)
Comparison: None.

CLINICAL DATA: Left posterior neck nodule.

EXAM:
CT NECK WITH CONTRAST
TECHNIQUE: Multidetector CT imaging of the neck was performed using the
standard protocol following the bolus administration of intravenous
contrast.
CONTRAST:  75mL OMNIPAQUE IOHEXOL 300 MG/ML  SOLN

[Series 2: axial neck neck (person_name) 2.00 · axial · 0.56mm/px · z∈[-707,-595]mm · 3 of 113 slices shown]
[im 29/113  bone]
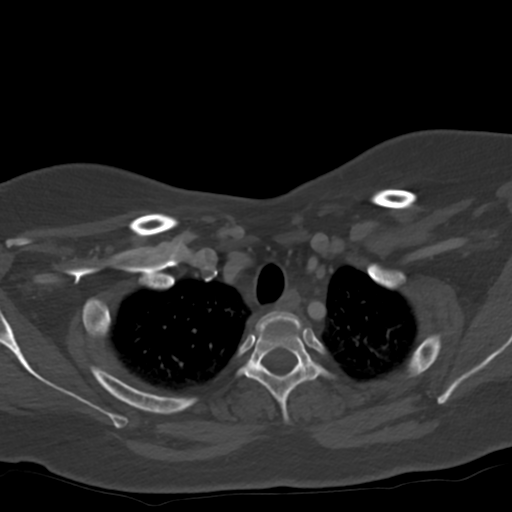
[im 57/113  bone]
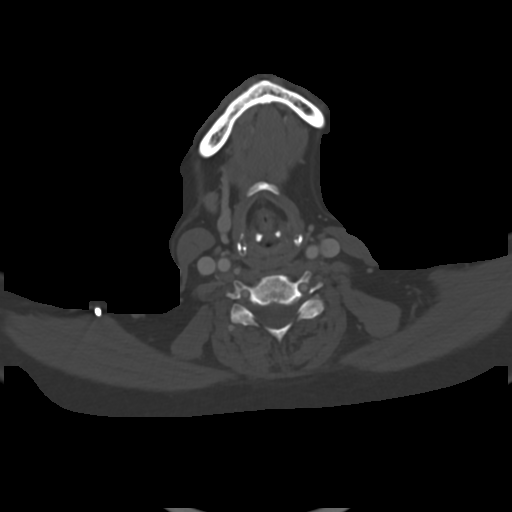
[im 85/113  bone]
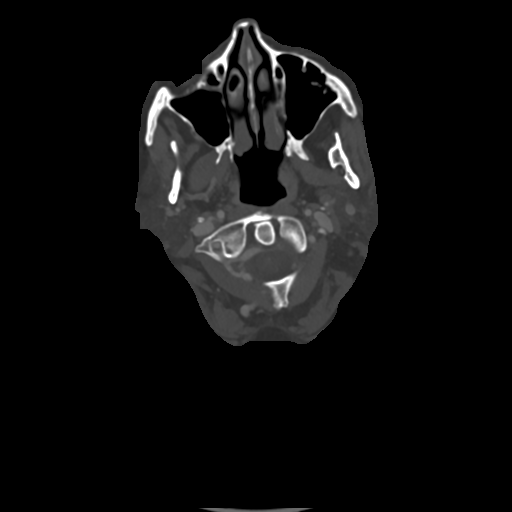

[Series 3: axial bone neck 2.00 · axial · 0.56mm/px · z∈[-689,-615]mm · 2 of 113 slices shown]
[im 38/113  bone]
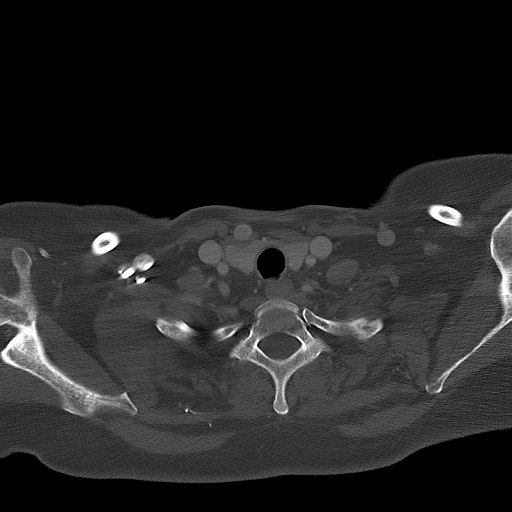
[im 75/113  bone]
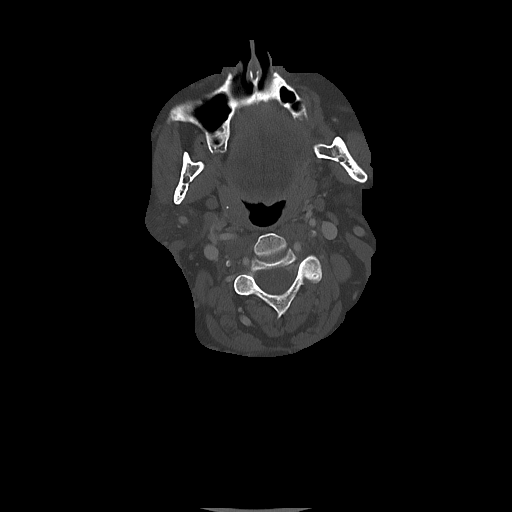

[Series 4: coronal neck neck (person_name) 2.00 cor · coronal · 0.55mm/px · 3 of 129 slices shown]
[im 26/129  bone]
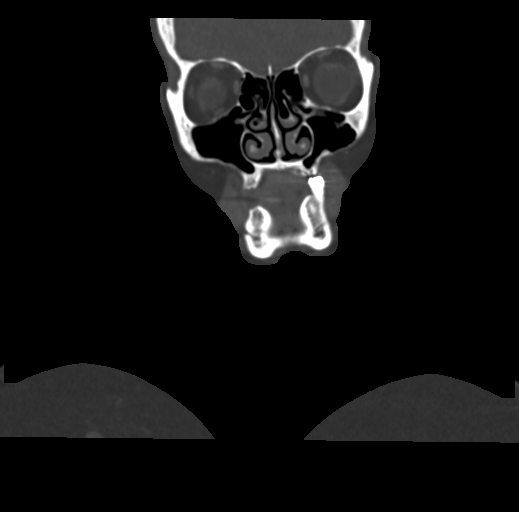
[im 52/129  bone]
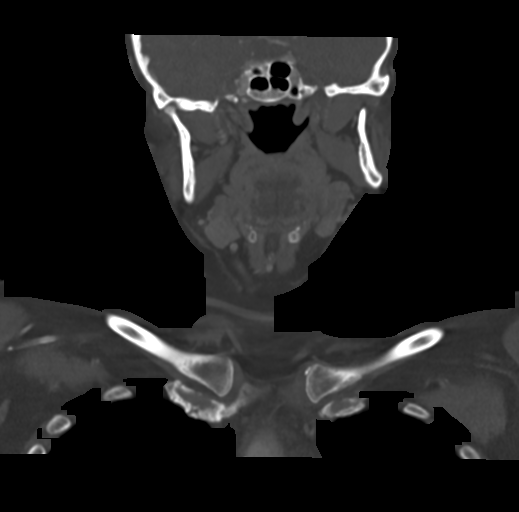
[im 77/129  bone]
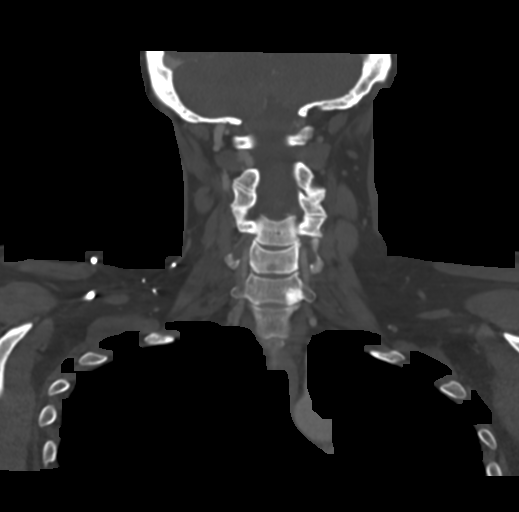

[Series 6: sagittal neck neck (person_name) 2.00 sag · sagittal · 0.51mm/px · 5 of 143 slices shown, 6 images]
[im 48/143  bone]
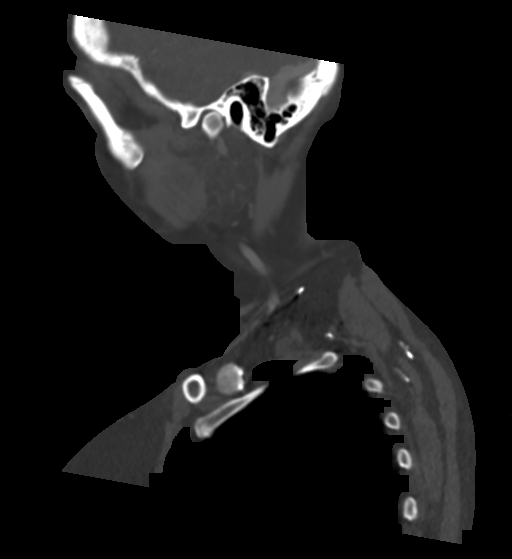
[im 60/143  bone]
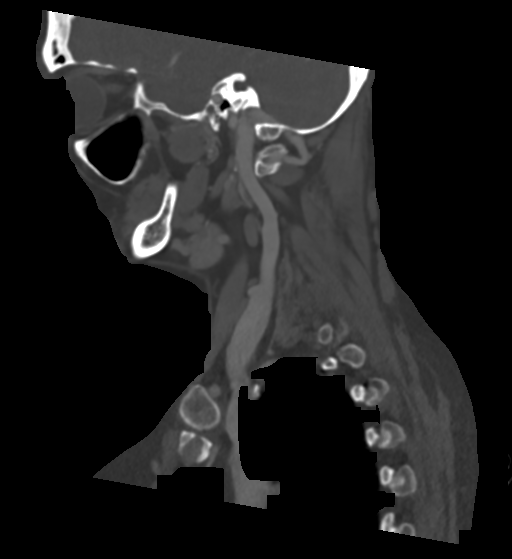
[im 72/143  soft-tissue]
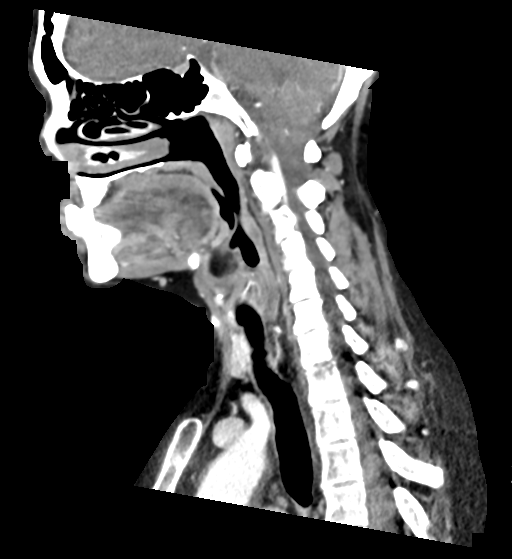
[im 72/143  bone]
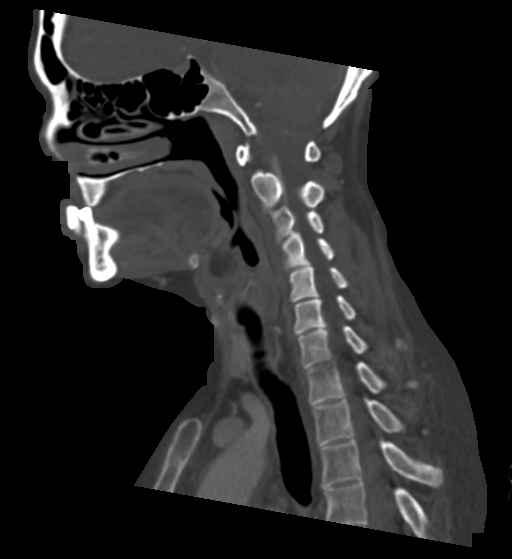
[im 83/143  bone]
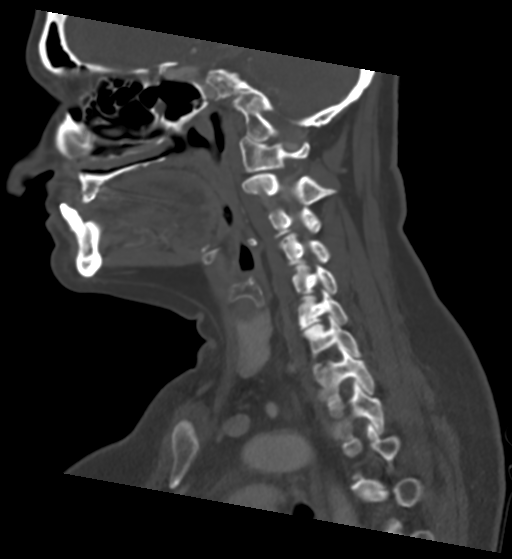
[im 95/143  bone]
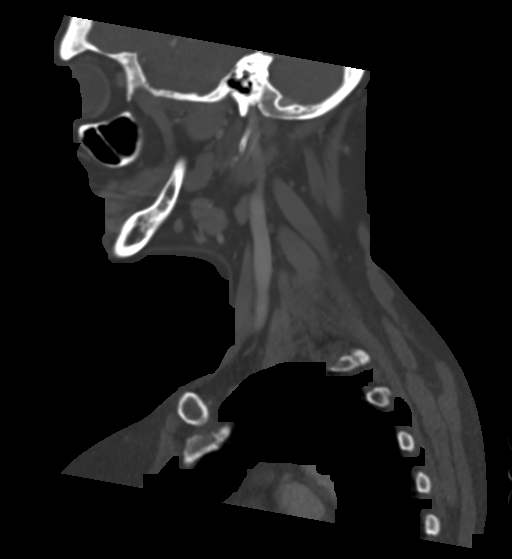

[Series 8: ax oropharynx neck neck (person_name) 2.00 ax · axial · 0.51mm/px · z∈[-745,-607]mm · 3 of 141 slices shown, 4 images]
[im 36/141  soft-tissue]
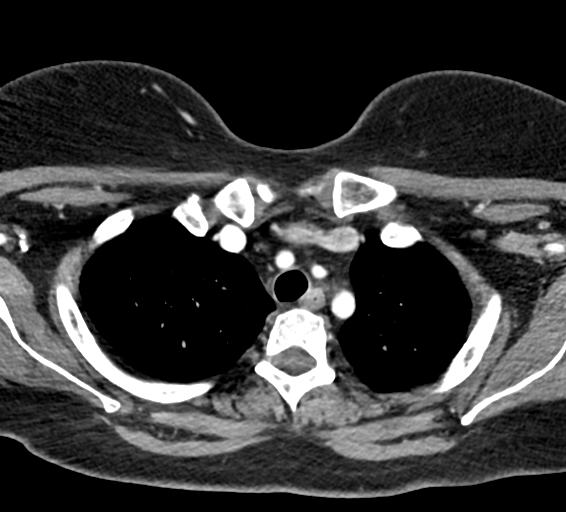
[im 36/141  bone]
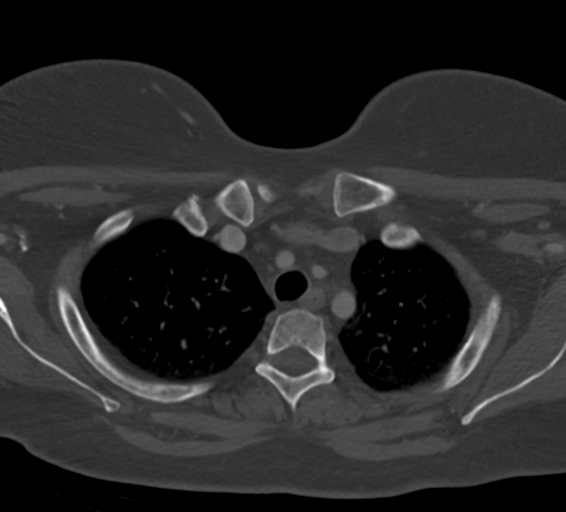
[im 71/141  bone]
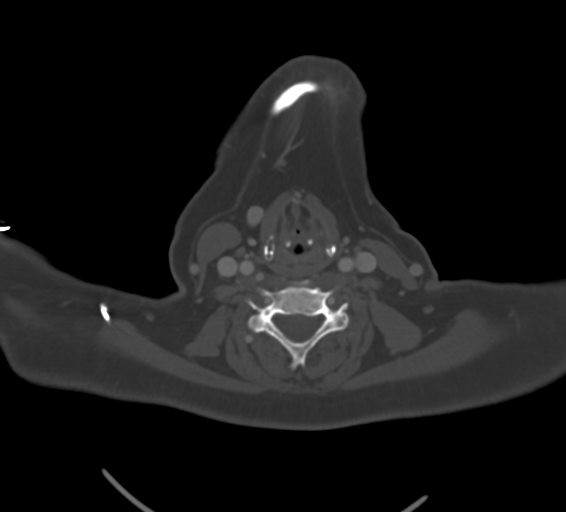
[im 106/141  bone]
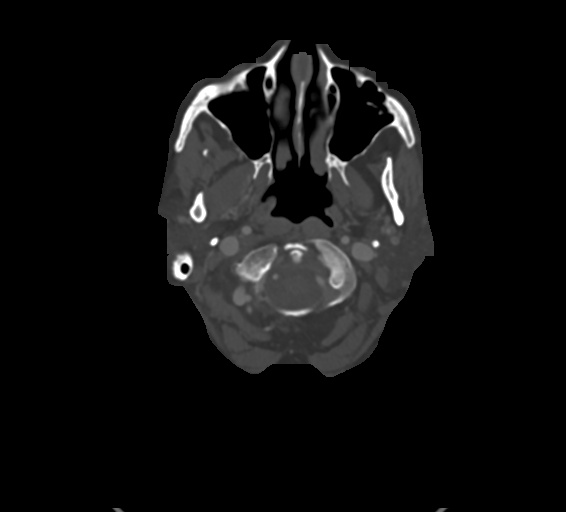

[16 of 33 positions shown; findings below may reference images not displayed]

FINDINGS: Pharynx and larynx: No focal mucosal or submucosal lesions are
present. The nasopharynx is within normal limits. Soft palate and
tongue base are within normal limits. Palatine tonsils are normal
bilaterally. Parapharyngeal fat is clear. Vallecula and epiglottis
are within normal limits. Aryepiglottic folds and piriform sinuses
are clear. Vocal cords are midline and symmetric. Trachea is clear.

Salivary glands: The submandibular and parotid glands and ducts are
within normal limits.

Thyroid: Normal

Lymph nodes: No significant cervical adenopathy is present. And
ovoid hyperdense node present in the superficial posterior left neck
measuring 7 x 6 x 10 mm. A smaller node on the right measures 4 mm
maximally. No other significant superficial nodes are present.

Vascular: No significant vascular lesions are present.

Limited intracranial: Within normal limits.

Visualized orbits: The globes and orbits are within normal limits.

Mastoids and visualized paranasal sinuses: The paranasal sinuses and
mastoid air cells are clear.

Skeleton: Endplate sclerotic changes at C6-7 on the left reflect
chronic degenerative disc disease. Straightening and slight reversal
of normal cervical lordosis is present. No focal osseous lesions are
present.

Upper chest: Mild dependent atelectasis is present. Lung apices are
otherwise clear. The thoracic inlet is within normal limits.
IMPRESSION: 1. 7 x 6 x 10 mm ovoid hyperdense node in the superficial posterior
left neck. This most likely represents a benign lymph node. This
corresponds with the palpable abnormality.
2. No significant anterior cervical adenopathy.
3. Endplate sclerotic changes at C6-7 on the left reflect chronic
degenerative disc disease.

## 2022-08-04 DIAGNOSIS — F411 Generalized anxiety disorder: Secondary | ICD-10-CM | POA: Diagnosis not present

## 2022-08-04 DIAGNOSIS — F331 Major depressive disorder, recurrent, moderate: Secondary | ICD-10-CM | POA: Diagnosis not present

## 2022-12-16 ENCOUNTER — Telehealth: Payer: Self-pay | Admitting: Nurse Practitioner

## 2022-12-16 DIAGNOSIS — Z1231 Encounter for screening mammogram for malignant neoplasm of breast: Secondary | ICD-10-CM

## 2022-12-16 NOTE — Telephone Encounter (Signed)
These issues need to be discussed in office. If it is for problems then it is a diagnostic colonoscopy vs a screening colonoscopy. I will place the order for the mammogram and she can call and get that scheduled. I will place the order at [facility] breast center

## 2022-12-16 NOTE — Telephone Encounter (Signed)
Patient called to schedule cpe, state she is also needing orders placed for mammogram, colonoscopy and TB skin test as required by her employer. Patient states she is having bowel related issues with constipation and this is why she is requesting to have her colonoscopy done. Patient has no preferred location

## 2022-12-17 NOTE — Telephone Encounter (Signed)
Called patient and reviewed Matts message. Patient verbalized understanding. Scheduled OV for 12/27/22.

## 2022-12-27 ENCOUNTER — Encounter: Payer: Self-pay | Admitting: Nurse Practitioner

## 2022-12-27 ENCOUNTER — Ambulatory Visit
Admission: RE | Admit: 2022-12-27 | Discharge: 2022-12-27 | Disposition: A | Discharge status: Home / Self Care | Payer: BC Managed Care – PPO | Source: Ambulatory Visit | Attending: Nurse Practitioner

## 2022-12-27 ENCOUNTER — Ambulatory Visit (INDEPENDENT_AMBULATORY_CARE_PROVIDER_SITE_OTHER)
Admission: RE | Admit: 2022-12-27 | Discharge: 2022-12-27 | Disposition: A | Discharge status: Home / Self Care | Payer: BC Managed Care – PPO | Source: Ambulatory Visit | Attending: Nurse Practitioner

## 2022-12-27 ENCOUNTER — Ambulatory Visit (INDEPENDENT_AMBULATORY_CARE_PROVIDER_SITE_OTHER): Payer: BC Managed Care – PPO | Admitting: Nurse Practitioner

## 2022-12-27 VITALS — BP 110/80 | HR 65 | Temp 97.9°F | Ht 68.0 in | Wt 209.0 lb

## 2022-12-27 DIAGNOSIS — R1084 Generalized abdominal pain: Secondary | ICD-10-CM

## 2022-12-27 DIAGNOSIS — G8929 Other chronic pain: Secondary | ICD-10-CM

## 2022-12-27 DIAGNOSIS — Z9049 Acquired absence of other specified parts of digestive tract: Secondary | ICD-10-CM | POA: Diagnosis not present

## 2022-12-27 DIAGNOSIS — M545 Low back pain, unspecified: Secondary | ICD-10-CM | POA: Diagnosis not present

## 2022-12-27 DIAGNOSIS — R350 Frequency of micturition: Secondary | ICD-10-CM | POA: Insufficient documentation

## 2022-12-27 DIAGNOSIS — M549 Dorsalgia, unspecified: Secondary | ICD-10-CM | POA: Diagnosis not present

## 2022-12-27 DIAGNOSIS — M47816 Spondylosis without myelopathy or radiculopathy, lumbar region: Secondary | ICD-10-CM | POA: Diagnosis not present

## 2022-12-27 DIAGNOSIS — M4316 Spondylolisthesis, lumbar region: Secondary | ICD-10-CM | POA: Diagnosis not present

## 2022-12-27 LAB — POCT URINALYSIS DIPSTICK
Bilirubin, UA: NEGATIVE
Blood, UA: NEGATIVE
Glucose, UA: NEGATIVE
Ketones, UA: NEGATIVE
Leukocytes, UA: NEGATIVE
Nitrite, UA: NEGATIVE
Protein, UA: NEGATIVE
Spec Grav, UA: <=1.005 — AB (ref 1.010–1.025)
Urobilinogen, UA: 0.2 U/dL
pH, UA: 6 (ref 5.0–8.0)

## 2022-12-27 LAB — COMPREHENSIVE METABOLIC PANEL
ALT: 30 U/L (ref 0–35)
AST: 20 U/L (ref 0–37)
Albumin: 4.3 g/dL (ref 3.5–5.2)
Alkaline Phosphatase: 100 U/L (ref 39–117)
BUN: 12 mg/dL (ref 6–23)
CO2: 31 meq/L (ref 19–32)
Calcium: 9.2 mg/dL (ref 8.4–10.5)
Chloride: 103 meq/L (ref 96–112)
Creatinine, Ser: 0.81 mg/dL (ref 0.40–1.20)
GFR: 81.31 mL/min (ref >60.00)
Glucose, Bld: 92 mg/dL (ref 70–99)
Potassium: 3.5 meq/L (ref 3.5–5.1)
Sodium: 139 meq/L (ref 135–145)
Total Bilirubin: 0.4 mg/dL (ref 0.2–1.2)
Total Protein: 7.4 g/dL (ref 6.0–8.3)

## 2022-12-27 LAB — CBC
HCT: 43.5 % (ref 36.0–46.0)
Hemoglobin: 14.1 g/dL (ref 12.0–15.0)
MCHC: 32.3 g/dL (ref 30.0–36.0)
MCV: 92.1 fl (ref 78.0–100.0)
Platelets: 304 10*3/uL (ref 150.0–400.0)
RBC: 4.72 Mil/uL (ref 3.87–5.11)
RDW: 12.7 % (ref 11.5–15.5)
WBC: 6.7 10*3/uL (ref 4.0–10.5)

## 2022-12-27 MED ORDER — IOPAMIDOL (ISOVUE-300) INJECTION 61%
500.0000 mL | Freq: Once | INTRAVENOUS | Status: AC | PRN
  Administered 2022-12-27: 100 mL via INTRAVENOUS

## 2022-12-27 NOTE — Assessment & Plan Note (Signed)
Injury at work a year ago. TTP. Pending lumbar xray

## 2022-12-27 NOTE — Patient Instructions (Addendum)
Nice to see you today I will be in touch with the labs, xray and CT scan once I have reviewed them  Follow up as scheduled, sooner if you need me

## 2022-12-27 NOTE — Assessment & Plan Note (Signed)
Concern for bowel obstruction vs mass affecting bowels. Abdomen swollen/distended in office. Has a history of bowel surgery. Pending stat CT scan

## 2022-12-27 NOTE — Progress Notes (Signed)
Acute Office Visit  Subjective:     Patient ID: Gina Montgomery, female    DOB: [birth year], 57 y.o.   MRN: [id number]  Chief Complaint  Patient presents with   Constipation    Pt complains of after having hysterectomy she started to notice pain in sternum down to side and lower abdomen to lower back over a month ago. Pt states that the bloating has not gone down. Pt complains of not being able to have bowel movements.    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 Pt states she hurt lower back and only wore a brace to heal.      Patient is in today for multiple complaints with a history that is non contributory   Abdominal bloating: states that she had a hx of a cyst in her abdomin. States that she had to hysterectomy and then approx 1 month ago she had some swelling  States that she trouble with her bowels for the past year since the foot surgeries. States that she was using a laxative. States that she is having diarrhea for the past week.  States that she is having a twisting pain the left flank and is peeing more frequently. 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 States that he passes out. States that she was picking him up to places into wheel chair. States that he fell forward onto her and her back hit the bed. States that she wore a back brace and then has been increasing the last four months. States that she does have to reach over and pull him. States hurts with movement. States numbness/tingling after she releases her bowels   Review of Systems  Constitutional:  Negative for chills and fever.  Gastrointestinal:  Positive for abdominal pain, constipation and diarrhea. Negative for nausea and vomiting.  Musculoskeletal:  Positive for back pain.        Objective:    BP 110/80   Pulse 65   Temp 97.9 F (36.6 C) (Temporal)   Ht 5\' 8"  (1.727 m)   Wt 209  lb (94.8 kg)   SpO2 96%   BMI 31.78 kg/m  BP Readings from Last 3 Encounters:  12/27/22 110/80  05/10/22 111/76  05/07/21 (!) 144/72   Wt Readings from Last 3 Encounters:  12/27/22 209 lb (94.8 kg)  05/07/21 202 lb 9.6 oz (91.9 kg)  02/05/21 191 lb 5.8 oz (86.8 kg)      Physical Exam Vitals and nursing note reviewed.  Constitutional:      Appearance: Normal appearance.  Cardiovascular:     Rate and Rhythm: Normal rate and regular rhythm.     Heart sounds: Normal heart sounds.  Pulmonary:     Effort: Pulmonary effort is normal.     Breath sounds: Normal breath sounds.  Abdominal:     General: Bowel sounds are normal. There is distension.     Palpations: There is no mass.     Tenderness: There is abdominal tenderness.     Hernia: No hernia is present.  Musculoskeletal:        General: Tenderness present.     Lumbar back: Bony tenderness present. No tenderness. Negative right straight leg raise test and negative left straight leg raise test.       Back:  Neurological:     Mental Status: She  is alert.     Deep Tendon Reflexes:     Reflex Scores:      Patellar reflexes are 2+ on the right side and 2+ on the left side.    Comments: Bilateral lower extremity strength 5/5     No results found for any visits on 12/27/22.      Assessment & Plan:   Problem List Items Addressed This Visit       Other   Urinary frequency - Primary    UA in office       Relevant Orders   POCT urinalysis dipstick   Generalized abdominal pain    Concern for bowel obstruction vs mass affecting bowels. Abdomen swollen/distended in office. Has a history of bowel surgery. Pending stat CT scan       Relevant Orders   CBC   Comprehensive metabolic panel   POCT urinalysis dipstick   CT ABDOMEN PELVIS W CONTRAST   Chronic left-sided low back pain without sciatica    Injury at work a year ago. TTP. Pending lumbar xray       Relevant Orders   DG Lumbar Spine Complete   Other Visit  Diagnoses     History of bowel resection           No orders of the defined types were placed in this encounter.   Return if symptoms worsen or fail to improve, for As scheduled .  Audria Nine, NP

## 2022-12-27 NOTE — Assessment & Plan Note (Signed)
UA in office 

## 2022-12-28 ENCOUNTER — Telehealth: Payer: Self-pay | Admitting: Nurse Practitioner

## 2022-12-28 NOTE — Telephone Encounter (Signed)
Called patient let know that results were not received in our office until today. Once reviewed will reach out to patient.

## 2022-12-28 NOTE — Telephone Encounter (Signed)
Patient called in and stated that she would like for a call back regarding her labs results taking on 12/27/22 ctc scan and back and spine scan patient stated she read results but don't understand them.

## 2022-12-28 NOTE — Telephone Encounter (Signed)
Called patient and reviewed the labs and ct scan result with her

## 2023-01-14 DIAGNOSIS — F411 Generalized anxiety disorder: Secondary | ICD-10-CM | POA: Diagnosis not present

## 2023-01-14 DIAGNOSIS — F331 Major depressive disorder, recurrent, moderate: Secondary | ICD-10-CM | POA: Diagnosis not present

## 2023-02-02 ENCOUNTER — Ambulatory Visit
Admission: RE | Admit: 2023-02-02 | Discharge: 2023-02-02 | Disposition: A | Discharge status: Home / Self Care | Payer: BC Managed Care – PPO | Source: Ambulatory Visit | Attending: Nurse Practitioner | Admitting: Nurse Practitioner

## 2023-02-02 DIAGNOSIS — Z1231 Encounter for screening mammogram for malignant neoplasm of breast: Secondary | ICD-10-CM | POA: Diagnosis not present

## 2023-02-04 ENCOUNTER — Telehealth: Payer: Self-pay | Admitting: Nurse Practitioner

## 2023-02-04 ENCOUNTER — Other Ambulatory Visit: Payer: Self-pay | Admitting: Nurse Practitioner

## 2023-02-04 DIAGNOSIS — R928 Other abnormal and inconclusive findings on diagnostic imaging of breast: Secondary | ICD-10-CM

## 2023-02-04 NOTE — Telephone Encounter (Signed)
Pt called in and stated she would like to get a called back to go over her test results from from mammogram if possible.

## 2023-02-07 NOTE — Telephone Encounter (Signed)
I have signed the orders for the additional imaging. They did not get a clear enough view in the right breast that is why they want furthering imaging. This does not mean she has breast cancer. They should be reaching out to her to get the other images scheduled. Depending on what they show dictates the next steps

## 2023-02-07 NOTE — Telephone Encounter (Signed)
Called patient states that she is concerned she has not received results. She was upset that she had to have right side checked then she had to wait all weekend for someone to call her. She has been sitting all weekend thinking she has breast cancer. I apologized to patient. Advised that the breast center is the one that contacts patient with results. We did receive them over the weekend. They have not been reviewed by provider yet. Let her know I am sending message to [facility] to have him review and will contact as soon as I have something. Advised that they are requesting additional imaging witch could mean something as simple as a shadow. Recommended that she reach out to the breast center to set up additional imaging.

## 2023-02-07 NOTE — Telephone Encounter (Signed)
Called patient and reviewed all information. Patient verbalized understanding. Will call if any further questions.  

## 2023-02-15 ENCOUNTER — Ambulatory Visit
Admission: RE | Admit: 2023-02-15 | Discharge: 2023-02-15 | Disposition: A | Discharge status: Home / Self Care | Payer: BC Managed Care – PPO | Source: Ambulatory Visit | Attending: Nurse Practitioner | Admitting: Nurse Practitioner

## 2023-02-15 DIAGNOSIS — R928 Other abnormal and inconclusive findings on diagnostic imaging of breast: Secondary | ICD-10-CM

## 2023-02-15 DIAGNOSIS — N6315 Unspecified lump in the right breast, overlapping quadrants: Secondary | ICD-10-CM | POA: Diagnosis not present

## 2023-02-15 DIAGNOSIS — R92321 Mammographic fibroglandular density, right breast: Secondary | ICD-10-CM | POA: Diagnosis not present

## 2023-02-15 DIAGNOSIS — R921 Mammographic calcification found on diagnostic imaging of breast: Secondary | ICD-10-CM | POA: Diagnosis not present

## 2023-03-09 ENCOUNTER — Encounter: Payer: Self-pay | Admitting: Nurse Practitioner

## 2023-03-09 ENCOUNTER — Ambulatory Visit (INDEPENDENT_AMBULATORY_CARE_PROVIDER_SITE_OTHER): Payer: BC Managed Care – PPO | Admitting: Nurse Practitioner

## 2023-03-09 VITALS — BP 118/72 | HR 67 | Temp 98.2°F | Ht 68.0 in | Wt 207.0 lb

## 2023-03-09 DIAGNOSIS — Z122 Encounter for screening for malignant neoplasm of respiratory organs: Secondary | ICD-10-CM

## 2023-03-09 DIAGNOSIS — Z87891 Personal history of nicotine dependence: Secondary | ICD-10-CM | POA: Diagnosis not present

## 2023-03-09 DIAGNOSIS — R160 Hepatomegaly, not elsewhere classified: Secondary | ICD-10-CM | POA: Insufficient documentation

## 2023-03-09 DIAGNOSIS — Z0000 Encounter for general adult medical examination without abnormal findings: Secondary | ICD-10-CM

## 2023-03-09 DIAGNOSIS — E7800 Pure hypercholesterolemia, unspecified: Secondary | ICD-10-CM

## 2023-03-09 DIAGNOSIS — Z1211 Encounter for screening for malignant neoplasm of colon: Secondary | ICD-10-CM

## 2023-03-09 DIAGNOSIS — F32A Depression, unspecified: Secondary | ICD-10-CM

## 2023-03-09 DIAGNOSIS — E669 Obesity, unspecified: Secondary | ICD-10-CM | POA: Diagnosis not present

## 2023-03-09 DIAGNOSIS — F419 Anxiety disorder, unspecified: Secondary | ICD-10-CM

## 2023-03-09 DIAGNOSIS — K769 Liver disease, unspecified: Secondary | ICD-10-CM

## 2023-03-09 DIAGNOSIS — Z1231 Encounter for screening mammogram for malignant neoplasm of breast: Secondary | ICD-10-CM

## 2023-03-09 LAB — URINALYSIS, MICROSCOPIC ONLY

## 2023-03-09 LAB — COMPREHENSIVE METABOLIC PANEL
ALT: 24 U/L (ref 0–35)
AST: 18 U/L (ref 0–37)
Albumin: 4.4 g/dL (ref 3.5–5.2)
Alkaline Phosphatase: 87 U/L (ref 39–117)
BUN: 14 mg/dL (ref 6–23)
CO2: 31 meq/L (ref 19–32)
Calcium: 9.2 mg/dL (ref 8.4–10.5)
Chloride: 103 meq/L (ref 96–112)
Creatinine, Ser: 0.75 mg/dL (ref 0.40–1.20)
GFR: 89.05 mL/min (ref >60.00)
Glucose, Bld: 81 mg/dL (ref 70–99)
Potassium: 4 meq/L (ref 3.5–5.1)
Sodium: 139 meq/L (ref 135–145)
Total Bilirubin: 0.4 mg/dL (ref 0.2–1.2)
Total Protein: 6.5 g/dL (ref 6.0–8.3)

## 2023-03-09 LAB — LIPID PANEL
Cholesterol: 152 mg/dL (ref 0–200)
HDL: 32.5 mg/dL — ABNORMAL LOW (ref >39.00)
LDL Cholesterol: 90 mg/dL (ref 0–99)
NonHDL: 119.17
Total CHOL/HDL Ratio: 5
Triglycerides: 145 mg/dL (ref 0.0–149.0)
VLDL: 29 mg/dL (ref 0.0–40.0)

## 2023-03-09 LAB — CBC
HCT: 41.8 % (ref 36.0–46.0)
Hemoglobin: 13.8 g/dL (ref 12.0–15.0)
MCHC: 32.9 g/dL (ref 30.0–36.0)
MCV: 92.4 fL (ref 78.0–100.0)
Platelets: 280 10*3/uL (ref 150.0–400.0)
RBC: 4.52 Mil/uL (ref 3.87–5.11)
RDW: 12.6 % (ref 11.5–15.5)
WBC: 5.6 10*3/uL (ref 4.0–10.5)

## 2023-03-09 LAB — TSH: TSH: 3.03 u[IU]/mL (ref 0.35–5.50)

## 2023-03-09 LAB — HEMOGLOBIN A1C: Hgb A1c MFr Bld: 5.6 % (ref 4.6–6.5)

## 2023-03-09 NOTE — Assessment & Plan Note (Signed)
Found on CT scan.  MRI placed today

## 2023-03-09 NOTE — Progress Notes (Signed)
Established Patient Office Visit  Subjective   Patient ID: Gina Montgomery, female    DOB: 1965/04/25  Age: 57 y.o. MRN: [id number]  Chief Complaint  Patient presents with   Annual Exam    HPI  for complete physical and follow up of chronic conditions.  Anxiety/depression: she is seen by [facility] and seen every 3 months.  They manage her venlafaxine and hydroxyzine  Immunizations: -Tetanus: Completed in unsure -Influenza: reufsed  -Shingles:  refused  -Pneumonia: Too young  Diet: Fair diet. She is eating 2 meals a day and no snacks. Water and unsweet  Exercise: No regular exercise. Speed walking   Eye exam: Completes annually glasses Dental exam: Completes semi-annually    Colonoscopy: Ambulatory referral for [place] [facility] Cancer Screening: Quit in 2022 smoked for 40 years 0.5pd  Pap smear: hystrectomy   Mammogram: 02/15/2023 repeat in 6 months   Dexa: Too young  Sleep: goes to bed aroun 11am and get up around 4 p        Review of Systems  Constitutional:  Negative for chills and fever.  Respiratory:  Negative for shortness of breath.   Cardiovascular:  Negative for chest pain and leg swelling.  Gastrointestinal:  Negative for abdominal pain, blood in stool, constipation, diarrhea, nausea and vomiting.       DM every other day   Genitourinary:  Negative for dysuria and hematuria.  Neurological:  Negative for tingling and headaches.  Psychiatric/Behavioral:  Negative for hallucinations and suicidal ideas.       Objective:     BP 118/72   Pulse 67   Temp 98.2 F (36.8 C) (Oral)   Ht 5\' 8"  (1.727 m)   Wt 207 lb (93.9 kg)   SpO2 96%   BMI 31.47 kg/m  BP Readings from Last 3 Encounters:  03/09/23 118/72  12/27/22 110/80  05/10/22 111/76   Wt Readings from Last 3 Encounters:  03/09/23 207 lb (93.9 kg)  12/27/22 209 lb (94.8 kg)  05/07/21 202 lb 9.6 oz (91.9 kg)   SpO2 Readings from Last 3 Encounters:  03/09/23 96%  12/27/22 96%  05/10/22  97%      Physical Exam Vitals and nursing note reviewed.  Constitutional:      Appearance: Normal appearance.  HENT:     Right Ear: Tympanic membrane, ear canal and external ear normal.     Left Ear: Tympanic membrane, ear canal and external ear normal.     Mouth/Throat:     Mouth: Mucous membranes are moist.     Pharynx: Oropharynx is clear.  Eyes:     Extraocular Movements: Extraocular movements intact.     Pupils: Pupils are equal, round, and reactive to light.  Cardiovascular:     Rate and Rhythm: Normal rate and regular rhythm.     Pulses: Normal pulses.     Heart sounds: Normal heart sounds.  Pulmonary:     Effort: Pulmonary effort is normal.     Breath sounds: Normal breath sounds.  Abdominal:     General: Bowel sounds are normal. There is no distension.     Palpations: There is no mass.     Tenderness: There is abdominal tenderness.     Hernia: No hernia is present.  Musculoskeletal:     Right lower leg: No edema.     Left lower leg: No edema.  Lymphadenopathy:     Cervical: No cervical adenopathy.  Skin:    General: Skin is warm.  Neurological:  General: No focal deficit present.     Mental Status: She is alert.     Deep Tendon Reflexes:     Reflex Scores:      Bicep reflexes are 2+ on the right side and 2+ on the left side.      Patellar reflexes are 2+ on the right side and 2+ on the left side.    Comments: Bilateral upper and lower extremity strength 5/5  Psychiatric:        Mood and Affect: Mood normal.        Behavior: Behavior normal.        Thought Content: Thought content normal.        Judgment: Judgment normal.      No results found for any visits on 03/09/23.    The 10-year ASCVD risk score (Arnett DK, et al., 2019) is: 6.3%    Assessment & Plan:   Problem List Items Addressed This Visit       Other   Anxiety and depression    Patient currently maintained on venlafaxine and hydroxyzine as needed.  She is followed by [facility].   Continue medication as prescribed continue following with specialist as recommended      Preventative health care - Primary    Discussed age-appropriate immunizations and screening exams.  Did review patient's personal, surgical, social, family histories.  Patient is up-to-date on all age-appropriate vaccinations she would like.  She refused the flu vaccine today.  Referral for colonoscopy for CRC screening placed today.  Mammogram is up-to-date.  Patient no longer does cervical cancer screening as she is status post hysterectomy.  Patient given information at discharge about preventative healthcare maintenance with anticipatory guidance.      Relevant Orders   CBC   Comprehensive metabolic panel   TSH   Elevated LDL cholesterol level    History of same pending LDL today      Relevant Orders   Lipid panel   Liver mass    Found on CT scan.  MRI placed today      Former tobacco use    Pending urine microscopy to rule out microscopic hematuria      Relevant Orders   Urine Microscopic   Obesity (BMI 30-39.9)    Pending TSH, A1c, lipid panel.  Continue working on lifestyle modifications      Relevant Orders   Hemoglobin A1c   Other Visit Diagnoses     Screening mammogram for breast cancer       Screening for colon cancer       Relevant Orders   Ambulatory referral to Gastroenterology   Screening for lung cancer       Relevant Orders   Ambulatory Referral Lung Cancer Screening [facility]   Liver disease       Relevant Orders   MR Abdomen W Wo Contrast       Return in about 1 year (around 03/08/2024) for CPE and Labs.    Audria Nine, NP

## 2023-03-09 NOTE — Assessment & Plan Note (Signed)
Discussed age-appropriate immunizations and screening exams.  Did review patient's personal, surgical, social, family histories.  Patient is up-to-date on all age-appropriate vaccinations she would like.  She refused the flu vaccine today.  Referral for colonoscopy for CRC screening placed today.  Mammogram is up-to-date.  Patient no longer does cervical cancer screening as she is status post hysterectomy.  Patient given information at discharge about preventative healthcare maintenance with anticipatory guidance.

## 2023-03-09 NOTE — Assessment & Plan Note (Signed)
Pending TSH, A1c, lipid panel.  Continue working on lifestyle modifications.

## 2023-03-09 NOTE — Assessment & Plan Note (Signed)
Patient currently maintained on venlafaxine and hydroxyzine as needed.  She is followed by [facility].  Continue medication as prescribed continue following with specialist as recommended

## 2023-03-09 NOTE — Assessment & Plan Note (Signed)
History of same pending LDL today

## 2023-03-09 NOTE — Assessment & Plan Note (Signed)
Pending urine microscopy to rule out microscopic hematuria

## 2023-03-09 NOTE — Patient Instructions (Signed)
Nice to see you today I will be in touch with the labs once I have them Follow up with me in 1 year, sooner if you need me  Consider getting the shingles vaccine

## 2023-03-15 ENCOUNTER — Encounter: Payer: Self-pay | Admitting: *Deleted

## 2023-03-22 NOTE — Addendum Note (Signed)
Addended by: Eden Emms on: 03/22/2023 12:41 PM   Modules accepted: Orders

## 2023-04-07 ENCOUNTER — Other Ambulatory Visit: Payer: BC Managed Care – PPO

## 2023-05-06 DIAGNOSIS — F411 Generalized anxiety disorder: Secondary | ICD-10-CM | POA: Diagnosis not present

## 2023-05-06 DIAGNOSIS — F331 Major depressive disorder, recurrent, moderate: Secondary | ICD-10-CM | POA: Diagnosis not present

## 2023-07-27 ENCOUNTER — Other Ambulatory Visit: Payer: Self-pay | Admitting: Nurse Practitioner

## 2023-07-27 DIAGNOSIS — R928 Other abnormal and inconclusive findings on diagnostic imaging of breast: Secondary | ICD-10-CM

## 2023-07-27 DIAGNOSIS — R921 Mammographic calcification found on diagnostic imaging of breast: Secondary | ICD-10-CM

## 2023-08-01 ENCOUNTER — Encounter (HOSPITAL_COMMUNITY): Payer: Self-pay | Admitting: *Deleted

## 2023-08-01 ENCOUNTER — Other Ambulatory Visit: Payer: Self-pay

## 2023-08-01 ENCOUNTER — Ambulatory Visit (HOSPITAL_COMMUNITY)
Admission: EM | Admit: 2023-08-01 | Discharge: 2023-08-01 | Disposition: A | Discharge status: Home / Self Care | Attending: Family Medicine | Admitting: Family Medicine

## 2023-08-01 DIAGNOSIS — W57XXXA Bitten or stung by nonvenomous insect and other nonvenomous arthropods, initial encounter: Secondary | ICD-10-CM | POA: Diagnosis not present

## 2023-08-01 DIAGNOSIS — L03211 Cellulitis of face: Secondary | ICD-10-CM | POA: Diagnosis not present

## 2023-08-01 MED ORDER — DOXYCYCLINE HYCLATE 100 MG PO CAPS: 100.0000 mg | ORAL_CAPSULE | Freq: Two times a day (BID) | ORAL | 0 refills | Status: AC

## 2023-08-01 MED ORDER — PREDNISONE 20 MG PO TABS: 40.0000 mg | ORAL_TABLET | Freq: Every day | ORAL | 0 refills | Status: AC

## 2023-08-01 NOTE — Discharge Instructions (Signed)
 Take doxycycline 100 mg --1 capsule 2 times daily for 7 days  Take prednisone 20 mg--2 daily for 5 days  Warm compresses to the area 2-3 times daily.  Take tylenol as needed for pain.

## 2023-08-01 NOTE — ED Provider Notes (Signed)
 [facility]    CSN: [id number] Arrival date & time: 08/01/23  0801      History   Chief Complaint Chief Complaint  Patient presents with   Insect Bite    HPI Gina Montgomery is a 58 y.o. female.   HPI Here for redness and tenderness and swelling on her right cheek.  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  She felt a sting on her right cheek and went to the bathroom and wiped off bug.  After that she had a little bit of itching and irritation and then she awoke yesterday morning with swelling and pain and redness of the right cheek.  No fever or chills  NKDA  The area has been itching but is also very tender.  Past medical history is negative for diabetes. Past Medical History:  Diagnosis Date   Anxiety    Depression    High cholesterol    Psoriasis    back of neck    Patient Active Problem List   Diagnosis Date Noted   Elevated LDL cholesterol level 03/09/2023   Liver mass 03/09/2023   Former tobacco use 03/09/2023   Obesity (BMI 30-39.9) 03/09/2023   Urinary frequency 12/27/2022   Generalized abdominal pain 12/27/2022   Chronic left-sided low back pain without sciatica 12/27/2022   Preventative health care 01/22/2021   Other fatigue 01/22/2021   Post-menopausal 01/22/2021   Establishing care with new doctor, encounter for 12/16/2020   Lump in neck 12/16/2020   Bilateral foot pain 12/16/2020   Muscle cramp 12/16/2020   Screening for tuberculosis 12/16/2020   Paresthesia 03/25/2016   Anxiety and depression 02/16/2016    Past Surgical History:  Procedure Laterality Date   ABDOMINAL HYSTERECTOMY     BUNIONECTOMY WITH HAMMERTOE RECONSTRUCTION Left 05/07/2021   Procedure: Third metatarsal condylectomy; Fourth and Fifth metatarsal osteotomies, hammertoe corrections; bunionette correction;  Surgeon: Amada Backer, MD;  Location: [facility];  Service: Orthopedics;  Laterality: Left;   FOOT  SURGERY Bilateral    75 bolts each foot   HAMMERTOE RECONSTRUCTION WITH WEIL OSTEOTOMY Right 02/05/2021   Procedure: 3-4 Weil osteotomy and revision hammertoe correction; 2-3 MTP joint dorsal capsulotomy and extensor tendon lengthening; correction of tailer's bunion;  Surgeon: Amada Backer, MD;  Location: [facility];  Service: Orthopedics;  Laterality: Right;   HARDWARE REMOVAL Right 02/05/2021   Procedure: Removal of deep implants 2-3 metatarsal;  Surgeon: Amada Backer, MD;  Location: [facility];  Service: Orthopedics;  Laterality: Right;   HARDWARE REMOVAL Left 05/07/2021   Procedure: Left foot removal of implants third metatarsal;  Surgeon: Amada Backer, MD;  Location: [facility];  Service: Orthopedics;  Laterality: Left;    OB History   No obstetric history on file.      Home Medications    Prior to Admission medications   Medication Sig Start Date End Date Taking? Authorizing Provider  doxycycline (VIBRAMYCIN) 100 MG capsule Take 1 capsule (100 mg total) by mouth 2 (two) times daily for 7 days. 08/01/23 08/08/23 Yes Jillien Yakel K, MD  predniSONE (DELTASONE) 20 MG tablet Take 2 tablets (40 mg total) by mouth daily with breakfast for 5 days. 08/01/23 08/06/23 Yes Devlin Mcveigh K, MD  cholecalciferol (VITAMIN D3) 25 MCG (1000 UNIT) tablet Take 1,000 Units by mouth daily. Patient not taking: Reported on 12/27/2022    [provider]  docusate sodium (COLACE) 100  MG capsule Take 1 capsule (100 mg total) by mouth 2 (two) times daily. While taking narcotic pain medicine. Patient not taking: Reported on 12/27/2022 02/05/21   Debbra Fairy, PA-C  hydrOXYzine (ATARAX/VISTARIL) 25 MG tablet Take 25 mg by mouth 3 (three) times daily. 12/11/20   [provider]  senna (SENOKOT) 8.6 MG TABS tablet Take 2 tablets (17.2 mg total) by mouth 2 (two) times daily. Patient not taking: Reported on 12/27/2022 02/05/21   Debbra Fairy, PA-C   venlafaxine (EFFEXOR) 75 MG tablet Take 75 mg by mouth every morning.    [provider]    Family History Family History  Problem Relation Age of Onset   Cancer Mother        leukemia   Cancer Maternal Aunt        breast cancer   Cancer Paternal Aunt        breast cancer   Heart disease Maternal Grandmother    Heart disease Maternal Grandfather    Alzheimer's disease Paternal Grandmother    Diabetes Neg Hx    Stroke Neg Hx     Social History Social History   Tobacco Use   Smoking status: Former    Current packs/day: 0.25    Average packs/day: 0.3 packs/day for 39.0 years (9.8 ttl pk-yrs)    Types: Cigarettes   Smokeless tobacco: Never   Tobacco comments:    Quit and stared back.  Vaping Use   Vaping status: Every Day   Substances: Nicotine  Substance Use Topics   Alcohol use: No   Drug use: No     Allergies   Patient has no known allergies.   Review of Systems Review of Systems   Physical Exam Triage Vital Signs ED Triage Vitals  Encounter Vitals Group     BP 08/01/23 0824 109/72     Systolic BP Percentile --      Diastolic BP Percentile --      Pulse Rate 08/01/23 0810 77     Resp 08/01/23 0810 20     Temp 08/01/23 0810 98.2 F (36.8 C)     Temp src --      SpO2 08/01/23 0810 99 %     Weight --      Height --      Head Circumference --      Peak Flow --      Pain Score 08/01/23 0809 8     Pain Loc --      Pain Education --      Exclude from Growth Chart --    No data found.  Updated Vital Signs BP 109/72 (BP Location: Right Arm)   Pulse 77   Temp 98.2 F (36.8 C)   Resp 20   SpO2 99%   Visual Acuity Right Eye Distance:   Left Eye Distance:   Bilateral Distance:    Right Eye Near:   Left Eye Near:    Bilateral Near:     Physical Exam Vitals reviewed.  Constitutional:      General: She is not in acute distress.    Appearance: She is not ill-appearing, toxic-appearing or diaphoretic.  HENT:     Mouth/Throat:      Mouth: Mucous membranes are moist.  Eyes:     Extraocular Movements: Extraocular movements intact.     Conjunctiva/sclera: Conjunctivae normal.     Pupils: Pupils are equal, round, and reactive to light.  Skin:    Coloration: Skin is not  pale.     Comments: There is an area of erythema about 4 cm in diameter on her right cheek.  The skin is indurated and tender.  There is a central area that has a little bit of a gray-yellow discoloration.  There is no fluctuance there.  Neurological:     Mental Status: She is alert and oriented to person, place, and time.  Psychiatric:        Behavior: Behavior normal.      UC Treatments / Results  Labs (all labs ordered are listed, but only abnormal results are displayed) Labs Reviewed - No data to display  EKG   Radiology No results found.  Procedures Procedures (including critical care time)  Medications Ordered in UC Medications - No data to display  Initial Impression / Assessment and Plan / UC Course  I have reviewed the triage vital signs and the nursing notes.  Pertinent labs & imaging results that were available during my care of the patient were reviewed by me and considered in my medical decision making (see chart for details).     Doxycycline was sent in to treat cellulitis and prednisone is sent in to treat what appears to be a reaction to an insect bite.  Final Clinical Impressions(s) / UC Diagnoses   Final diagnoses:  Cellulitis of face  Reaction to insect bite     Discharge Instructions      Take doxycycline 100 mg --1 capsule 2 times daily for 7 days  Take prednisone 20 mg--2 daily for 5 days  Warm compresses to the area 2-3 times daily.  Take tylenol as needed for pain.    ED Prescriptions     Medication Sig Dispense Auth. Provider   doxycycline (VIBRAMYCIN) 100 MG capsule Take 1 capsule (100 mg total) by mouth 2 (two) times daily for 7 days. 14 capsule Dameon Soltis K, MD   predniSONE  (DELTASONE) 20 MG tablet Take 2 tablets (40 mg total) by mouth daily with breakfast for 5 days. 10 tablet Ellsworth Haas Lakeria Starkman K, MD      PDMP not reviewed this encounter.   Ann Keto, MD 08/01/23 [phone]

## 2023-08-01 NOTE — ED Triage Notes (Signed)
 PT reports possible insect bite on SAt night. Pt has a red swollen area of skin on Rt face.

## 2023-08-04 DIAGNOSIS — F331 Major depressive disorder, recurrent, moderate: Secondary | ICD-10-CM | POA: Diagnosis not present

## 2023-08-04 DIAGNOSIS — F411 Generalized anxiety disorder: Secondary | ICD-10-CM | POA: Diagnosis not present

## 2023-11-07 DIAGNOSIS — F331 Major depressive disorder, recurrent, moderate: Secondary | ICD-10-CM | POA: Diagnosis not present

## 2023-11-07 DIAGNOSIS — F411 Generalized anxiety disorder: Secondary | ICD-10-CM | POA: Diagnosis not present

## 2024-01-26 DIAGNOSIS — F411 Generalized anxiety disorder: Secondary | ICD-10-CM | POA: Diagnosis not present

## 2024-01-26 DIAGNOSIS — F331 Major depressive disorder, recurrent, moderate: Secondary | ICD-10-CM | POA: Diagnosis not present

## 2024-05-23 ENCOUNTER — Ambulatory Visit: Payer: Self-pay | Admitting: *Deleted

## 2024-05-23 NOTE — Telephone Encounter (Signed)
" ° ° °  FYI Only or Action Required?: Action required by provider: request for appointment, referral request, update on patient condition, and patient has questions regarding insurance coverage before scheduling appt.  Patient was last seen in primary care on 03/09/2023 by Wendee Lynwood HERO, NP.  Called Nurse Triage reporting Back Pain.  Symptoms began several weeks ago.  Interventions attempted: Prescription medications: medrol dose pack not taking correctly and tinazidine.  Symptoms are: gradually worsening.  Triage Disposition: See PCP When Office is Open (Within 3 Days)  Patient/caregiver understands and will follow disposition?: No, wishes to speak with PCP   Patient declined appt until insurance can be checked for coverage of providers that can see her. Denies severe pain now but recommended if pain severe call back or go to ED. See UC visit yesterday . Please advise.               Reason for Disposition  [1] MODERATE back pain (e.g., interferes with normal activities) AND [2] present > 3 days  Answer Assessment - Initial Assessment Questions Offered appt with other provider today or tomorrow. None available with PCP until March. Patient has questions regarding her insurance coverage and which providers she can see. Reports new insurance # for prescribers. Please advise with scheduling once patient knows insurance will cover appt. Prescriber # [id number]. Patient requesting referral for her back issues. Patient requesting call back. Recommended if pain worsens go to ED.         1. ONSET: When did the pain begin? (e.g., minutes, hours, days)     2 weeks ago  2. LOCATION: Where does it hurt? (upper, mid or lower back)     Back, shoulders, legs 3. SEVERITY: How bad is the pain?  (e.g., Scale 1-10; mild, moderate, or severe)     Moderate  7/10 4. PATTERN: Is the pain constant? (e.g., yes, no; constant, intermittent)      Intermittent  5. RADIATION: Does the  pain shoot into your legs or somewhere else?     Did not answer 6. CAUSE:  What do you think is causing the back pain?      Private duty caregiver, doing total care for patient 7. BACK OVERUSE:  Any recent lifting of heavy objects, strenuous work or exercise?     Heavy lifting of patient 8. MEDICINES: What have you taken so far for the pain? (e.g., nothing, acetaminophen , NSAIDS)     Reports she only took one tablet of medrol dose pack.  9. NEUROLOGIC SYMPTOMS: Do you have any weakness, numbness, or problems with bowel/bladder control?     Denies numbness and weakness, pain when walking  10. OTHER SYMPTOMS: Do you have any other symptoms? (e.g., fever, abdomen pain, burning with urination, blood in urine)      Low back pain across hips, back, shoulders, hips. No fever no urination issues. 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  Protocols used: Back Pain-A-AH  "

## 2024-05-23 NOTE — Telephone Encounter (Signed)
 Spoke with pt and she has been scheduled to see Gina Montgomery on 05/24/24 at 1200.

## 2024-05-24 ENCOUNTER — Encounter: Payer: Self-pay | Admitting: Primary Care

## 2024-05-24 ENCOUNTER — Ambulatory Visit: Admitting: Primary Care

## 2024-05-24 VITALS — BP 118/66 | HR 66 | Temp 98.4°F | Ht 68.0 in | Wt 206.5 lb

## 2024-05-24 DIAGNOSIS — M546 Pain in thoracic spine: Secondary | ICD-10-CM | POA: Insufficient documentation

## 2024-05-24 DIAGNOSIS — K5900 Constipation, unspecified: Secondary | ICD-10-CM

## 2024-05-24 MED ORDER — SENNA 8.6 MG PO TABS: 1.0000 | ORAL_TABLET | Freq: Every day | ORAL | 0 refills | Status: AC | PRN

## 2024-05-24 NOTE — Patient Instructions (Signed)
 Continue the muscle relaxer and steroid medication as prescribed.  Be sure to move and stretch daily as permitted.  It was a pleasure meeting you!

## 2024-05-24 NOTE — Progress Notes (Signed)
 "  Subjective:    Patient ID: Gina Montgomery, female    DOB: February 20, 1966, 59 y.o.   MRN: [id number]  Gina Montgomery is a very pleasant 59 y.o. female patient of Oneil, NP with a history of paresthesias, bilateral foot pain, chronic left-sided low back pain without sciatica, tobacco use who presents today to discuss back pain  Her pain is located to the mid lower back which began on 01/14/206 after an injury.  She believes she was straining her back while assisting her immobile and bed bound client.  She was squatting to assist her client, rolled her over and then pulled her back. She works as a engineer, structural.    Her pain initially began to her lower back and has since moved to the thoracic back and shoulders.  She is also noticed cramping down her lower extremities to her toes.  Evaluated at urgent care through [facility] 2 days ago for the same problem.  During her urgent care visit she was treated with Toradol and steroid injection.  She was prescribed tizanidine and Medrol Dosepak.  Today she continues to experience pain to her lower and thoracic back, shoulders, across her chest. Her pain bothers her with certain movements including reaching, pushing, pulling, squatting. She's worked twice since her injury which has increased her pain due to the requirement.   She needs a work note excusing her from these physical activities with her job. She would like to start her work note today lasting two weeks. She would also like a refill of her Senokot.   Review of Systems  Respiratory:  Negative for shortness of breath.   Cardiovascular:  Negative for chest pain.  Musculoskeletal:  Positive for arthralgias, back pain and myalgias.  Neurological:  Negative for numbness.         Past Medical History:  Diagnosis Date   Anxiety    Depression    High cholesterol    Psoriasis    back of neck    Social History   Socioeconomic History   Marital status: Single    Spouse name: Not on file    Number of children: 0   Years of education: 110   Highest education level: Not on file  Occupational History   Occupation: Self- employed   Tobacco Use   Smoking status: Former    Current packs/day: 0.25    Average packs/day: 0.3 packs/day for 39.0 years (9.8 ttl pk-yrs)    Types: Cigarettes   Smokeless tobacco: Never   Tobacco comments:    Quit and stared back.  Vaping Use   Vaping status: Every Day   Substances: Nicotine  Substance and Sexual Activity   Alcohol use: No   Drug use: No   Sexual activity: Never  Other Topics Concern   Not on file  Social History Narrative   Lives  With mother   Caffeine use: 1 cup a day (coffee)   Drinks 1 soda per day   Social Drivers of Health   Tobacco Use: Medium Risk (05/24/2024)   Patient History    Smoking Tobacco Use: Former    Smokeless Tobacco Use: Never    Passive Exposure: Not on Actuary Strain: Not on file  Food Insecurity: Low Risk (05/22/2024)   Received from [facility]   Epic    Within the past 12 months, you worried that your food would run out before you got money to buy more: Never true    Within the past 12  months, the food you bought just didn't last and you didn't have money to get more. : Never true  Transportation Needs: No Transportation Needs (05/22/2024)   Received from [facility]    In the past 12 months, has lack of reliable transportation kept you from medical appointments, meetings, work or from getting things needed for daily living? : No  Physical Activity: Not on file  Stress: Not on file  Social Connections: Not on file  Intimate Partner Violence: Not on file  Depression (PHQ2-9): Low Risk (05/24/2024)   Depression (PHQ2-9)    PHQ-2 Score: 4  Alcohol Screen: Not on file  Housing: Low Risk (05/22/2024)   Received from [facility]   Epic    What is your living situation today?: I have a steady place to live    Think about the place you live. Do you have problems  with any of the following? Choose all that apply:: Not on file  Utilities: Low Risk (05/22/2024)   Received from [facility]   Utilities    In the past 12 months has the electric, gas, oil, or water company threatened to shut off services in your home? : No  Health Literacy: Not on file    Past Surgical History:  Procedure Laterality Date   ABDOMINAL HYSTERECTOMY     BUNIONECTOMY WITH HAMMERTOE RECONSTRUCTION Left 05/07/2021   Procedure: Third metatarsal condylectomy; Fourth and Fifth metatarsal osteotomies, hammertoe corrections; bunionette correction;  Surgeon: Kit Rush, MD;  Location: [facility];  Service: Orthopedics;  Laterality: Left;   FOOT SURGERY Bilateral    75 bolts each foot   HAMMERTOE RECONSTRUCTION WITH WEIL OSTEOTOMY Right 02/05/2021   Procedure: 3-4 Weil osteotomy and revision hammertoe correction; 2-3 MTP joint dorsal capsulotomy and extensor tendon lengthening; correction of tailer's bunion;  Surgeon: Kit Rush, MD;  Location: [facility];  Service: Orthopedics;  Laterality: Right;   HARDWARE REMOVAL Right 02/05/2021   Procedure: Removal of deep implants 2-3 metatarsal;  Surgeon: Kit Rush, MD;  Location: [facility];  Service: Orthopedics;  Laterality: Right;   HARDWARE REMOVAL Left 05/07/2021   Procedure: Left foot removal of implants third metatarsal;  Surgeon: Kit Rush, MD;  Location: [facility];  Service: Orthopedics;  Laterality: Left;    Family History  Problem Relation Age of Onset   Cancer Mother        leukemia   Cancer Maternal Aunt        breast cancer   Cancer Paternal Aunt        breast cancer   Heart disease Maternal Grandmother    Heart disease Maternal Grandfather    Alzheimer's disease Paternal Grandmother    Diabetes Neg Hx    Stroke Neg Hx     Allergies[1]  Medications Ordered Prior to Encounter[2]  BP 118/66   Pulse 66   Temp 98.4 F (36.9 C) (Oral)   Ht 5' 8  (1.727 m)   Wt 206 lb 8 oz (93.7 kg)   SpO2 96%   BMI 31.40 kg/m  Objective:   Physical Exam Constitutional:      Comments: Appears uncomfortable in exam room. Changing position slowly due to pain  Cardiovascular:     Rate and Rhythm: Normal rate.  Pulmonary:     Effort: Pulmonary effort is normal.  Musculoskeletal:     Right shoulder: Tenderness present. Normal range of motion.     Left shoulder: Tenderness present. Normal range  of motion.     Thoracic back: Tenderness present. Decreased range of motion.     Lumbar back: Tenderness present. Decreased range of motion.     Comments: Decrease in ROM with movement.  Skin:    General: Skin is warm and dry.     Physical Exam        Assessment & Plan:  Constipation, unspecified constipation type -     Senna; Take 1-2 tablets (8.6-17.2 mg total) by mouth daily as needed for mild constipation.  Dispense: 30 tablet; Refill: 0  Acute bilateral thoracic back pain Assessment & Plan: Secondary to overexertion. She is certainly uncomfortable today during exam, even with light palpation.  Continue Medrol Dosepak and tizanidine as prescribed. Urgent care notes from [facility] reviewed through [facility].  Agree to remain out of work for the next 2 weeks with instructions to stretch when applicable. Follow-up as needed     Assessment and Plan Assessment & Plan         Comer MARLA Gaskins, NP       [1] No Known Allergies [2]  Current Outpatient Medications on File Prior to Visit  Medication Sig Dispense Refill   cholecalciferol (VITAMIN D3) 25 MCG (1000 UNIT) tablet Take 1,000 Units by mouth daily.     hydrOXYzine (ATARAX/VISTARIL) 25 MG tablet Take 25 mg by mouth 3 (three) times daily.     venlafaxine (EFFEXOR) 75 MG tablet Take 75 mg by mouth every morning.     docusate sodium  (COLACE) 100 MG capsule Take 1 capsule (100 mg total) by mouth 2 (two) times daily. While taking narcotic pain medicine. (Patient not  taking: Reported on 12/27/2022) 30 capsule 0   No current facility-administered medications on file prior to visit.   "

## 2024-05-24 NOTE — Assessment & Plan Note (Signed)
 Secondary to overexertion. She is certainly uncomfortable today during exam, even with light palpation.  Continue Medrol Dosepak and tizanidine as prescribed. Urgent care notes from [facility] reviewed through [facility].  Agree to remain out of work for the next 2 weeks with instructions to stretch when applicable. Follow-up as needed

## 2024-05-31 ENCOUNTER — Ambulatory Visit: Admitting: Nurse Practitioner
